# Patient Record
Sex: Female | Born: 1960 | Race: White | Hispanic: No | Marital: Single | Smoking: Never smoker
Health system: Southern US, Community
[De-identification: ages and names within clinical notes are randomized; demographics above are authoritative.]

## PROBLEM LIST (undated history)

## (undated) DIAGNOSIS — J45909 Unspecified asthma, uncomplicated: Secondary | ICD-10-CM

## (undated) DIAGNOSIS — E119 Type 2 diabetes mellitus without complications: Secondary | ICD-10-CM

## (undated) DIAGNOSIS — R011 Cardiac murmur, unspecified: Secondary | ICD-10-CM

## (undated) DIAGNOSIS — N189 Chronic kidney disease, unspecified: Secondary | ICD-10-CM

## (undated) DIAGNOSIS — M1990 Unspecified osteoarthritis, unspecified site: Secondary | ICD-10-CM

## (undated) HISTORY — DX: Cardiac murmur, unspecified: R01.1

## (undated) HISTORY — PX: BREAST BIOPSY: SHX20

## (undated) HISTORY — PX: LUMBAR SPINE SURGERY: SHX701

## (undated) HISTORY — PX: ANKLE SURGERY: SHX546

## (undated) HISTORY — DX: Unspecified asthma, uncomplicated: J45.909

## (undated) HISTORY — DX: Type 2 diabetes mellitus without complications: E11.9

## (undated) HISTORY — PX: GASTRIC BYPASS: SHX52

## (undated) HISTORY — DX: Chronic kidney disease, unspecified: N18.9

## (undated) HISTORY — DX: Unspecified osteoarthritis, unspecified site: M19.90

---
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
## 2013-12-16 HISTORY — PX: CATARACT EXTRACTION: SUR2

## 2013-12-16 HISTORY — PX: TOTAL KNEE ARTHROPLASTY: SHX125

## 2014-07-26 DIAGNOSIS — R109 Unspecified abdominal pain: Secondary | ICD-10-CM | POA: Insufficient documentation

## 2014-07-26 DIAGNOSIS — D649 Anemia, unspecified: Secondary | ICD-10-CM | POA: Insufficient documentation

## 2014-07-26 DIAGNOSIS — M2550 Pain in unspecified joint: Secondary | ICD-10-CM | POA: Insufficient documentation

## 2015-10-19 DIAGNOSIS — Z96651 Presence of right artificial knee joint: Secondary | ICD-10-CM | POA: Insufficient documentation

## 2016-05-05 DIAGNOSIS — D72829 Elevated white blood cell count, unspecified: Secondary | ICD-10-CM | POA: Insufficient documentation

## 2016-06-04 DIAGNOSIS — M4626 Osteomyelitis of vertebra, lumbar region: Secondary | ICD-10-CM | POA: Insufficient documentation

## 2016-09-11 DIAGNOSIS — Z981 Arthrodesis status: Secondary | ICD-10-CM | POA: Insufficient documentation

## 2018-09-03 DIAGNOSIS — S21209A Unspecified open wound of unspecified back wall of thorax without penetration into thoracic cavity, initial encounter: Secondary | ICD-10-CM | POA: Insufficient documentation

## 2018-09-15 ENCOUNTER — Encounter (HOSPITAL_BASED_OUTPATIENT_CLINIC_OR_DEPARTMENT_OTHER): Payer: Worker's Compensation | Attending: Internal Medicine

## 2018-09-15 DIAGNOSIS — Z981 Arthrodesis status: Secondary | ICD-10-CM | POA: Insufficient documentation

## 2018-09-15 DIAGNOSIS — Y831 Surgical operation with implant of artificial internal device as the cause of abnormal reaction of the patient, or of later complication, without mention of misadventure at the time of the procedure: Secondary | ICD-10-CM | POA: Insufficient documentation

## 2018-09-15 DIAGNOSIS — G629 Polyneuropathy, unspecified: Secondary | ICD-10-CM | POA: Diagnosis not present

## 2018-09-15 DIAGNOSIS — T8463XA Infection and inflammatory reaction due to internal fixation device of spine, initial encounter: Secondary | ICD-10-CM | POA: Diagnosis not present

## 2018-09-15 DIAGNOSIS — T8131XA Disruption of external operation (surgical) wound, not elsewhere classified, initial encounter: Secondary | ICD-10-CM | POA: Insufficient documentation

## 2018-09-15 DIAGNOSIS — N186 End stage renal disease: Secondary | ICD-10-CM | POA: Insufficient documentation

## 2018-09-16 ENCOUNTER — Other Ambulatory Visit (HOSPITAL_COMMUNITY)
Admission: RE | Admit: 2018-09-16 | Discharge: 2018-09-16 | Disposition: A | Discharge status: Home / Self Care | Payer: Medicare Other | Source: Other Acute Inpatient Hospital | Attending: Internal Medicine | Admitting: Internal Medicine

## 2018-09-16 DIAGNOSIS — L98428 Non-pressure chronic ulcer of back with other specified severity: Secondary | ICD-10-CM | POA: Insufficient documentation

## 2018-09-21 ENCOUNTER — Telehealth: Payer: Self-pay | Admitting: *Deleted

## 2018-09-21 NOTE — Telephone Encounter (Signed)
RN received message that patient had a question about her upcoming appointment. RN attempted to return call, but it was not answered and no option to leave a message. Patient has a new patient appointment 10/17 with Dr Luciana Axe, referral notes up front. Andree Coss, RN

## 2018-09-22 LAB — AEROBIC/ANAEROBIC CULTURE (SURGICAL/DEEP WOUND)

## 2018-09-22 LAB — AEROBIC/ANAEROBIC CULTURE W GRAM STAIN (SURGICAL/DEEP WOUND)

## 2018-09-23 ENCOUNTER — Ambulatory Visit: Payer: Self-pay | Admitting: Internal Medicine

## 2018-09-24 NOTE — Telephone Encounter (Signed)
RN left message asking patient to call back if she still had questions about her upcoming appointment.

## 2018-09-29 DIAGNOSIS — T8131XA Disruption of external operation (surgical) wound, not elsewhere classified, initial encounter: Secondary | ICD-10-CM | POA: Diagnosis not present

## 2018-10-01 ENCOUNTER — Encounter: Payer: Self-pay | Admitting: Internal Medicine

## 2018-10-01 ENCOUNTER — Ambulatory Visit (INDEPENDENT_AMBULATORY_CARE_PROVIDER_SITE_OTHER): Payer: Worker's Compensation | Admitting: Internal Medicine

## 2018-10-01 DIAGNOSIS — M4626 Osteomyelitis of vertebra, lumbar region: Secondary | ICD-10-CM

## 2018-10-01 DIAGNOSIS — Z981 Arthrodesis status: Secondary | ICD-10-CM | POA: Diagnosis not present

## 2018-10-01 DIAGNOSIS — M869 Osteomyelitis, unspecified: Secondary | ICD-10-CM | POA: Diagnosis not present

## 2018-10-01 NOTE — Progress Notes (Signed)
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 Leanord Hawking    Patient ID: Alison Monroe, female    DOB: 09-08-1961, 57 y.o.   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  She has a long history of spinal drainage with multiple previous surgeries and hardware placement and has been followed by several orthopedists and neurosurgeons in [place] and more recently at [facility] and has been seen by infectious diseases in [place], at the [place] clinic, and also more recently an inpatient visit at [facility].  She has moved here to [place] to be closer to a brother.  It appears part from her past notes that she has been very resistant to have definitive surgical treatment of her back by removal of the hardware.  She in the past has had MSSA and MRSA growth and has been on multiple courses of oral antibiotics as well as long-term IV antibiotics.  It was advised to her that further antibiotics for the short-term or for cure of this infection are not going to be effective in curing this infection without surgical management, in particular removal of the hardware.  She is here today at the request of her wound care physician. The nonhealing wound has been present since 2014 and she previously has undergone revision of a prior lumbar fusion extension to T9 to sacrum by a Dr. Callie Fielding in [place].  Her initial surgery was in 1997 and had been complicated by multiple infections and revisions, including plastic surgery and flap placement.  She has been told that she has fractured hardware and she has continued chronic back pain.  She has been seen at [facility] clinic as well as a new [facility] for neurosurgical recommendations and they recommended removal of the implants and to treat with IV antibiotics after removal.  She at the time did  not want to have the hardware removed and was in the process of moving to [place].  She saw a neurosurgeon, Dr. Noralee Stain, at [facility] who had recommended immediate surgery due to potential for spinal cord injury.  She though refused and wanted to get her "life affairs" in order in [place] and returned to [place] after that.  It was advised that she when she returns she presented to the emergency room at [facility] for management, which is advised to be a neurosurgeon, plastic surgeon and infectious diseases.  She is here today for my evaluation from infectious diseases and is following Dr. Leanord Hawking of wound care. Most information via Care Everywhere summarized above.  She tells me she is very hesitant to get surgery.  She has not yet moved to [facility] but is still in the process.  Her PCP in [place] suggested she see ID for another course of prolonged IV antibiotics with hope of curing the infection. She has intermittent times of drainage.    PMH: history of lumbar surgery in 1997 Chronic pain GERD  Prior to Admission medications   Not on File   Current Outpatient Medications on File Prior to Visit  Medication Sig Dispense Refill  . fentaNYL (DURAGESIC - DOSED MCG/HR) 100 MCG/HR Place 1 patch onto the skin every other day.    Marland Kitchen acetaminophen (TYLENOL) 325 MG tablet Take 2 tablets by mouth 2 (two) times daily  as needed.    . baclofen (LIORESAL) 10 MG tablet Take 1 tablet by mouth 3 (three) times daily.    . cyanocobalamin (,VITAMIN B-12,) 1000 MCG/ML injection Inject 1 mL into the skin.    Marland Kitchen HYDROmorphone (DILAUDID) 8 MG tablet Take 2 tablets by mouth every 6 (six) hours.    Marland Kitchen omeprazole (PRILOSEC) 20 MG capsule Take 1 capsule by mouth 2 (two) times daily.    . potassium chloride (KCL) 2 mEq/mL SOLN oral liquid Take 1 tablet by mouth 2 (two) times daily.    Marland Kitchen spironolactone (ALDACTONE) 25 MG tablet Take 1 tablet by mouth.     No current facility-administered medications on  file prior to visit.     Allergies: penicillins, erythromycin Vancomycin - renal failure  SH: no tobacco   Assessment: chronic lumbar infection with retained hardware.  I had a long discussion with the patient regarding her best options of cure of the infection.  I emphasized that she will need a multidisciplinary approach and absolutely will require hardware removal to attempt a cure.  I explained that continue treatment without that will not result in cure.  She is fortunate not to have significant bone involvement yet or episodes of bacteremia.   I recommended she go back to [facility] asap for further recommendations.  I am available after surgery to continue treatment locally including home health needs once the plan is in place by presumably [facility], after necessary surgical intervention.   Can use alternatives to vancomycin for treatment.   She also requests referral to pain management.   Plan: 1) go to [facility] 2) referral for pain management sent 3) remain off of antibiotics as long as possible prior to any surgical cultures  60 minutes spent with the patient including 30 minutes face to face discussion/counseling of her history and options.

## 2018-10-02 ENCOUNTER — Encounter: Payer: Self-pay | Admitting: Internal Medicine

## 2018-10-29 LAB — HEPATIC FUNCTION PANEL
ALT: 13 (ref 7–35)
AST: 17 (ref 13–35)
Alkaline Phosphatase: 130 — AB (ref 25–125)
Bilirubin, Total: 0.4

## 2018-10-29 LAB — CBC AND DIFFERENTIAL
HCT: 38 (ref 36–46)
Hemoglobin: 11.9 — AB (ref 12.0–16.0)
Neutrophils Absolute: 73
Platelets: 370 (ref 150–399)
WBC: 9.7

## 2018-10-29 LAB — BASIC METABOLIC PANEL
BUN: 23 — AB (ref 4–21)
Creatinine: 1.6 — AB (ref 0.5–1.1)
Glucose: 106
Potassium: 4.9 (ref 3.4–5.3)
Sodium: 138 (ref 137–147)

## 2018-11-06 ENCOUNTER — Encounter (HOSPITAL_BASED_OUTPATIENT_CLINIC_OR_DEPARTMENT_OTHER): Payer: Medicare Other | Attending: Internal Medicine

## 2018-11-06 DIAGNOSIS — N186 End stage renal disease: Secondary | ICD-10-CM | POA: Insufficient documentation

## 2018-11-06 DIAGNOSIS — T8142XA Infection following a procedure, deep incisional surgical site, initial encounter: Secondary | ICD-10-CM | POA: Insufficient documentation

## 2018-11-06 DIAGNOSIS — T8131XA Disruption of external operation (surgical) wound, not elsewhere classified, initial encounter: Secondary | ICD-10-CM | POA: Diagnosis not present

## 2018-11-06 DIAGNOSIS — G629 Polyneuropathy, unspecified: Secondary | ICD-10-CM | POA: Diagnosis not present

## 2018-11-06 DIAGNOSIS — Y838 Other surgical procedures as the cause of abnormal reaction of the patient, or of later complication, without mention of misadventure at the time of the procedure: Secondary | ICD-10-CM | POA: Insufficient documentation

## 2018-12-01 DIAGNOSIS — M4316 Spondylolisthesis, lumbar region: Secondary | ICD-10-CM | POA: Insufficient documentation

## 2018-12-01 DIAGNOSIS — M5416 Radiculopathy, lumbar region: Secondary | ICD-10-CM | POA: Insufficient documentation

## 2018-12-01 DIAGNOSIS — M48061 Spinal stenosis, lumbar region without neurogenic claudication: Secondary | ICD-10-CM | POA: Insufficient documentation

## 2018-12-07 ENCOUNTER — Encounter (HOSPITAL_BASED_OUTPATIENT_CLINIC_OR_DEPARTMENT_OTHER): Payer: Medicare Other | Attending: Internal Medicine

## 2018-12-07 DIAGNOSIS — Y838 Other surgical procedures as the cause of abnormal reaction of the patient, or of later complication, without mention of misadventure at the time of the procedure: Secondary | ICD-10-CM | POA: Diagnosis not present

## 2018-12-07 DIAGNOSIS — B9561 Methicillin susceptible Staphylococcus aureus infection as the cause of diseases classified elsewhere: Secondary | ICD-10-CM | POA: Insufficient documentation

## 2018-12-07 DIAGNOSIS — T8131XA Disruption of external operation (surgical) wound, not elsewhere classified, initial encounter: Secondary | ICD-10-CM | POA: Insufficient documentation

## 2018-12-07 DIAGNOSIS — N186 End stage renal disease: Secondary | ICD-10-CM | POA: Diagnosis not present

## 2018-12-17 ENCOUNTER — Ambulatory Visit (INDEPENDENT_AMBULATORY_CARE_PROVIDER_SITE_OTHER): Payer: Medicare Other | Admitting: Family Medicine

## 2018-12-17 ENCOUNTER — Other Ambulatory Visit: Payer: Self-pay

## 2018-12-17 ENCOUNTER — Encounter: Payer: Self-pay | Admitting: Family Medicine

## 2018-12-17 ENCOUNTER — Encounter: Payer: Self-pay | Admitting: *Deleted

## 2018-12-17 VITALS — BP 152/80 | HR 97 | Temp 98.8°F | Resp 16 | Ht 62.0 in | Wt 193.0 lb

## 2018-12-17 DIAGNOSIS — G894 Chronic pain syndrome: Secondary | ICD-10-CM

## 2018-12-17 DIAGNOSIS — M5441 Lumbago with sciatica, right side: Secondary | ICD-10-CM | POA: Diagnosis not present

## 2018-12-17 DIAGNOSIS — S21209A Unspecified open wound of unspecified back wall of thorax without penetration into thoracic cavity, initial encounter: Secondary | ICD-10-CM | POA: Diagnosis not present

## 2018-12-17 DIAGNOSIS — M1710 Unilateral primary osteoarthritis, unspecified knee: Secondary | ICD-10-CM | POA: Insufficient documentation

## 2018-12-17 DIAGNOSIS — R7303 Prediabetes: Secondary | ICD-10-CM | POA: Insufficient documentation

## 2018-12-17 DIAGNOSIS — N183 Chronic kidney disease, stage 3 unspecified: Secondary | ICD-10-CM | POA: Insufficient documentation

## 2018-12-17 DIAGNOSIS — M4636 Infection of intervertebral disc (pyogenic), lumbar region: Secondary | ICD-10-CM

## 2018-12-17 DIAGNOSIS — N6019 Diffuse cystic mastopathy of unspecified breast: Secondary | ICD-10-CM | POA: Insufficient documentation

## 2018-12-17 DIAGNOSIS — Z9884 Bariatric surgery status: Secondary | ICD-10-CM | POA: Insufficient documentation

## 2018-12-17 DIAGNOSIS — F1120 Opioid dependence, uncomplicated: Secondary | ICD-10-CM

## 2018-12-17 DIAGNOSIS — Z6832 Body mass index (BMI) 32.0-32.9, adult: Secondary | ICD-10-CM | POA: Insufficient documentation

## 2018-12-17 DIAGNOSIS — M5136 Other intervertebral disc degeneration, lumbar region: Secondary | ICD-10-CM | POA: Insufficient documentation

## 2018-12-17 DIAGNOSIS — G8929 Other chronic pain: Secondary | ICD-10-CM

## 2018-12-17 DIAGNOSIS — E538 Deficiency of other specified B group vitamins: Secondary | ICD-10-CM

## 2018-12-17 DIAGNOSIS — I872 Venous insufficiency (chronic) (peripheral): Secondary | ICD-10-CM | POA: Insufficient documentation

## 2018-12-17 DIAGNOSIS — Z22322 Carrier or suspected carrier of Methicillin resistant Staphylococcus aureus: Secondary | ICD-10-CM | POA: Insufficient documentation

## 2018-12-17 DIAGNOSIS — K219 Gastro-esophageal reflux disease without esophagitis: Secondary | ICD-10-CM | POA: Insufficient documentation

## 2018-12-17 DIAGNOSIS — M179 Osteoarthritis of knee, unspecified: Secondary | ICD-10-CM | POA: Insufficient documentation

## 2018-12-17 NOTE — Patient Instructions (Signed)
Please return in 1-3 months for recheck  We will call you with information regarding your referral appointment. Orthopedics at [facility], pain management and home health for wound care.  If you do not hear from [place] within the next 2 weeks, please let me know. It can take 1-2 weeks to get appointments set up with the specialists.    It was a pleasure meeting you today! Thank you for choosing [place] to meet your healthcare needs! I truly look forward to working with you. If you have any questions or concerns, please send me a message via Mychart or call the office at [phone].

## 2018-12-17 NOTE — Progress Notes (Signed)
Subjective  CC:  Chief Complaint  Patient presents with  . Establish Care    PCP in [place].. States that in 3 months or so she will be moving here.. Wants referral for home health due to wound    HPI: Alison Monroe is a 58 y.o. female who presents to [facility] at [facility] today to establish care with me as a new patient.  Reviewed chart; multiple high risk medications on chronic narcotics and benzo's.  She has the following concerns or needs:  58 year old single female disabled after work-related injury to low back in the early 40s.  Burst fractures to the lumbar spine with over 20 surgeries since.  Is managed by chronic pain in [place] where she currently lives, she is relocating here in the next 3 months.  She is on high-dose chronic narcotics and benzos, also on Topamax and baclofen for back pain and muscle cramps.  She has reported right radiculopathy.  Recent MRI was reviewed.  She is here because she needs a referral to pain management and orthopedics.  She currently is being treated by infectious disease and wound care due to an intervertebral infection.  She currently is on Bactrim twice daily.  She needs home health care for dressing changes to be ordered.  Functionally, she walks with a walker and can complete her ADLs and most instruments of daily living.  She is moving here to be with her brother.  She may need surgery again if the infection does not clear.  She is hoping to avoid this.  She has chronic peripheral venous insufficiency requiring high-dose diuretics.  This is neurologic in nature.  History of diabetes and morbid obesity status past bariatric surgery.  Most recent A1c was reportedly 6.22 weeks ago.  I will review old records.  Health maintenance: She reports the most things are up-to-date.  Will need old records to review and will update chart once done.  She denies mood problems.  No recent falls.  Assessment  1. Wound of back,  unspecified laterality, initial encounter   2. Narcotic dependence (HCC)   3. Chronic pain syndrome   4. Chronic bilateral low back pain with right-sided sciatica   5. Infection of intervertebral disc (pyogenic), lumbar region (HCC)   6. 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  Referred to chronic pain management due to chronic narcotic dependence.  Home health referral placed as well for wound care.  She will continue with infectious disease who is managing her infection.  Reviewed other medications.  No medication changes at this time.  Return in 1 month for follow-up and recheck.  Will update medical record and update health maintenance needs  Follow up:  Return in about 4 weeks (around 01/14/2019) for recheck. Orders Placed This Encounter  Procedures  . Ambulatory referral to Pain Clinic  . Ambulatory referral to Orthopedic Surgery   No orders of the defined types were placed in this encounter.    Depression screen PHQ 2/9 12/17/2018  Decreased Interest 0  Down, Depressed, Hopeless 0  PHQ - 2 Score 0    We updated and reviewed the patient's past history in detail and it is documented below.  Patient Active Problem List   Diagnosis Date Noted  . Degeneration of lumbar intervertebral disc 12/17/2018  Priority: High  . Infection of intervertebral disc (pyogenic), lumbar region [facility](HCC) 12/17/2018    Priority: High  . Narcotic dependence (HCC) 12/17/2018    Priority: High  . Peripheral venous insufficiency 12/17/2018    Priority: High  . Prediabetes 12/17/2018    Priority: High  . Chronic kidney disease, stage 3 (moderate) (HCC) 12/17/2018    Priority: High  . Chronic bilateral low back pain with right-sided sciatica 12/17/2018    Priority: High  . Chronic pain syndrome 12/17/2018     Priority: High  . Body mass index (bmi) 32.0-32.9, adult 12/17/2018    Priority: Medium  . Knee osteoarthritis 12/17/2018    Priority: Medium  . MRSA (methicillin resistant Staphylococcus aureus) colonization 12/17/2018    Priority: Medium  . S/P bariatric surgery 12/17/2018    Priority: Medium  . Fibrocystic breast changes 12/17/2018    Priority: Low  . Gastro-esophageal reflux disease without esophagitis 12/17/2018    Priority: Low  . S/P lumbar fusion 09/11/2016    Priority: Low  . Vitamin B 12 deficiency 12/17/2018  . Back wound 09/03/2018  . Leukocytosis 05/05/2016  . Status post total right knee replacement 10/19/2015   Health Maintenance  Topic Date Due  . Hepatitis C Screening  Feb 26, 1961  . HIV Screening  07/14/1976  . PAP SMEAR-Modifier  07/14/1982  . MAMMOGRAM  07/15/2011  . TETANUS/TDAP  12/17/2024  . COLONOSCOPY  12/17/2025  . INFLUENZA VACCINE  Completed    There is no immunization history on file for this patient. Current Meds  Medication Sig  . acetaminophen (TYLENOL) 325 MG tablet Take 2 tablets by mouth 2 (two) times daily as needed.  . baclofen (LIORESAL) 10 MG tablet Take 1 tablet by mouth 3 (three) times daily.  . cyanocobalamin (,VITAMIN B-12,) 1000 MCG/ML injection Inject 1 mL into the skin.  . diazepam (VALIUM) 10 MG tablet Take 1 tablet by mouth 3 (three) times daily.  . fentaNYL (DURAGESIC - DOSED MCG/HR) 100 MCG/HR Place 1 patch onto the skin every other day.  Marland Kitchen. HYDROmorphone (DILAUDID) 8 MG tablet Take 2 tablets by mouth every 6 (six) hours.  Marland Kitchen. morphine (MS CONTIN) 100 MG 12 hr tablet Take 100 mg by mouth every 12 (twelve) hours.  Marland Kitchen. omeprazole (PRILOSEC) 20 MG capsule Take 1 capsule by mouth 2 (two) times daily.  . potassium chloride (KCL) 2 mEq/mL SOLN oral liquid Take 1 tablet by mouth 2 (two) times daily.  Marland Kitchen. spironolactone (ALDACTONE) 25 MG tablet Take 1 tablet by mouth.  . temazepam (RESTORIL) 30 MG capsule Take 1 capsule by mouth at  bedtime.  Marland Kitchen. tiZANidine (ZANAFLEX) 4 MG tablet Take 1 tablet by mouth 3 (three) times daily.  Marland Kitchen. topiramate (TOPAMAX) 50 MG tablet Take 2 tablets by mouth 3 (three) times daily.  Marland Kitchen. torsemide (DEMADEX) 100 MG tablet Take 1 tablet by mouth daily.  . [DISCONTINUED] metoprolol tartrate (LOPRESSOR) 50 MG tablet Take 50 mg by mouth daily.    Allergies: Patient is allergic to erythromycin base; penicillins; vancomycin; and adhesive  [tape]. Past Medical History Patient  has a past medical history of Arthritis, Chronic kidney disease, Diabetes (HCC), and Heart murmur. Past Surgical History Patient  has a past surgical history that includes Gallbladder surgery (1981); Breast biopsy; Lumbar spine surgery; Gastric bypass; and Ankle surgery (Left). Family History: Patient family history includes Arthritis in her maternal grandmother and mother; Asthma in her maternal grandmother and mother; COPD in her father and mother; Cancer in her maternal grandfather; Depression  in her mother; Diabetes in her brother, father, maternal grandmother, and mother; Early death in her father and mother; Heart attack in her father and mother; Heart disease in her brother and mother; Hyperlipidemia in her brother and mother; Hypertension in her brother, father, and mother; Kidney disease in her father; Stroke in her father. Social History:  Patient  reports that she has never smoked. She has never used smokeless tobacco. She reports that she does not drink alcohol or use drugs.  Review of Systems: Constitutional: negative for fever or malaise Ophthalmic: negative for photophobia, double vision or loss of vision Cardiovascular: negative for chest pain, dyspnea on exertion, or new LE swelling Respiratory: negative for SOB or persistent cough Gastrointestinal: negative for abdominal pain, change in bowel habits or melena Genitourinary: negative for dysuria or gross hematuria Musculoskeletal: negative for new gait disturbance or  muscular weakness, positive for chronic pain Integumentary: negative for new or persistent rashes Neurological: negative for TIA or stroke symptoms Psychiatric: negative for SI or delusions Allergic/Immunologic: negative for hives  Patient Care Team    Relationship Specialty Notifications Start End  Willow OraAndy, Roen Macgowan L, MD PCP - General Family Medicine  12/17/18     Objective  Vitals: BP (!) 152/80   Pulse 97   Temp 98.8 F (37.1 C) (Oral)   Resp 16   Ht 5\' 2"  (1.575 m)   Wt 193 lb (87.5 kg)   SpO2 95%   BMI 35.30 kg/m  General:  Well developed, well nourished, no acute distress  Psych:  Alert and oriented,normal mood and affect HEENT:  Normocephalic, atraumatic, non-icteric sclera, PERRL, oropharynx is without mass or exudate, supple neck without adenopathy, mass or thyromegaly Cardiovascular:  RRR without gallop, rub or murmur, nondisplaced PMI, +2 pitting edema bilateral lower extremities Respiratory:  Good breath sounds bilaterally, CTAB with normal respiratory effort Gastrointestinal: normal bowel sounds, soft, non-tender, no noted masses. No HSM MSK: She has scoliosis and kyphosis, Skin:  Warm, no rashes or suspicious lesions noted, low lumbar area with open wound, iodoform gauze in place with clear dry edges. Neurologic:    Mental status is normal.  Ambulates with walker  Commons side effects, risks, benefits, and alternatives for medications and treatment plan prescribed today were discussed, and the patient expressed understanding of the given instructions. Patient is instructed to call or message via MyChart if he/she has any questions or concerns regarding our treatment plan. No barriers to understanding were identified. We discussed Red Flag symptoms and signs in detail. Patient expressed understanding regarding what to do in case of urgent or emergency type symptoms.   Medication list was reconciled, printed and provided to the patient in AVS. Patient instructions and summary  information was reviewed with the patient as documented in the AVS. This note was prepared with assistance of Dragon voice recognition software. Occasional wrong-word or sound-a-like substitutions may have occurred due to the inherent limitations of voice recognition software

## 2018-12-24 ENCOUNTER — Other Ambulatory Visit: Payer: Self-pay | Admitting: *Deleted

## 2018-12-24 ENCOUNTER — Telehealth: Payer: Self-pay

## 2018-12-24 DIAGNOSIS — S21209A Unspecified open wound of unspecified back wall of thorax without penetration into thoracic cavity, initial encounter: Secondary | ICD-10-CM

## 2018-12-24 NOTE — Telephone Encounter (Signed)
Did you get a home health referral for this patient?

## 2018-12-24 NOTE — Telephone Encounter (Signed)
There is no referral to Home Health.

## 2018-12-24 NOTE — Telephone Encounter (Signed)
Pt is aware that referral is being worked on.

## 2018-12-24 NOTE — Telephone Encounter (Signed)
Copied from CRM [id number]. Topic: Referral - Question >> Dec 24, 2018 12:58 PM Laural Benes, [place] C wrote: Reason for CRM: pt says that she has not received a call from any of her referral locations. Pt says that she is also suppose to have a home health referral. Pt says that she has a wound that has to be changed by a nurse every day 2 times daily due to drainage. Pt says at the moment she has a family member helping her but she really need to have home health services. Pt would like further assistance with this.   CB: [id number]

## 2018-12-25 NOTE — Telephone Encounter (Signed)
Made pt aware of all referrals, pt still has a question regarding a new on set with back pain. Please see note from Clydie Braun.

## 2018-12-25 NOTE — Telephone Encounter (Signed)
Have you worked on this referral yet?

## 2018-12-25 NOTE — Telephone Encounter (Signed)
Patient called back today to find out why she did not receive a call back from anyone today. She will be calling her insurance company to see if they can give her some help in finding a Home Care agency. She also requested a call from Dr Mardelle Matte or her nurse to find out if she should come in for a pain that she has been experiencing in her back. She stated that it felt as if something popped and now she is having difficulty walking and doing other things. She would like to know if Dr Mardelle Matte want her to come in for Xrays or any other testing. Please advise Ph# [phone]

## 2018-12-28 NOTE — Telephone Encounter (Signed)
Please see note from Clydie Braun and advise.

## 2018-12-28 NOTE — Telephone Encounter (Signed)
PT HAS BEEN SCHEDULED FOR 12/30/17

## 2018-12-28 NOTE — Telephone Encounter (Signed)
Pt may schedule an appointment although I am not certain I will be able to help her back pain given her history and chronic strong pain medications.

## 2018-12-29 NOTE — Telephone Encounter (Signed)
Pt called in requesting to have orders placed for a Cat Scan. Pt says that her Ortho/spinal physician Dr. Maudry Diego is requesting this for an apt on Thursday. Pt says that if she can get imaging completed then she dont feel that the ov with PCP for back pain will still be needed.   Please advise pt further on if PCP can place orders/assist with imaging order.

## 2018-12-29 NOTE — Telephone Encounter (Signed)
I am unable to order the CT scan; ;will need to see specialist and they will order needed testing after evaluation.

## 2018-12-29 NOTE — Telephone Encounter (Signed)
Please see below and advise.

## 2018-12-29 NOTE — Telephone Encounter (Signed)
Pt is aware and verbalized understanding.

## 2018-12-30 ENCOUNTER — Ambulatory Visit: Payer: Medicare Other | Admitting: Family Medicine

## 2019-01-01 ENCOUNTER — Ambulatory Visit (HOSPITAL_COMMUNITY)
Admission: RE | Admit: 2019-01-01 | Discharge: 2019-01-01 | Disposition: A | Discharge status: Home / Self Care | Payer: Medicare Other | Source: Ambulatory Visit | Attending: Family Medicine | Admitting: Family Medicine

## 2019-01-01 ENCOUNTER — Other Ambulatory Visit (HOSPITAL_COMMUNITY): Payer: Self-pay | Admitting: Family Medicine

## 2019-01-01 ENCOUNTER — Telehealth: Payer: Self-pay | Admitting: Family Medicine

## 2019-01-01 DIAGNOSIS — M2518 Fistula, other specified site: Secondary | ICD-10-CM

## 2019-01-01 DIAGNOSIS — Z981 Arthrodesis status: Secondary | ICD-10-CM

## 2019-01-01 DIAGNOSIS — T84119A Breakdown (mechanical) of internal fixation device of unspecified bone of limb, initial encounter: Secondary | ICD-10-CM | POA: Diagnosis present

## 2019-01-01 NOTE — Telephone Encounter (Signed)
Per Dr. Mardelle Matte, ok for verbal orders

## 2019-01-01 NOTE — Telephone Encounter (Signed)
Copied from CRM [id number]. Topic: Quick Communication - See Telephone Encounter >> Jan 01, 2019  1:44 PM Burchel, Abbi R wrote: CRM for notification. See Telephone encounter for: 01/01/19.  Darlene [phone] today) or Nicolette [phone] on/after Monday) requesting v/o for nursing/wound care 1x1wk, 2x1wk, 3x1wk and Home Health Aide 2x2wk for [facility]. Also, requesting v/o for- eval re: PT, OT, and MSW.

## 2019-01-01 NOTE — Telephone Encounter (Signed)
Darlene given verbal orders.

## 2019-01-05 ENCOUNTER — Telehealth: Payer: Self-pay | Admitting: Family Medicine

## 2019-01-05 ENCOUNTER — Ambulatory Visit (HOSPITAL_COMMUNITY): Payer: Medicare Other

## 2019-01-05 NOTE — Telephone Encounter (Signed)
Copied from CRM [id number]. Topic: Quick Communication - Home Health Verbal Orders >> Jan 05, 2019  9:21 AM Percival Spanish wrote: Caller/Agency   Tatiana with [facility]   Callback Number  [phone]  Requesting verbal orders PT   Frequency   2 x 2    1 x 1 effective 01/03/2019

## 2019-01-05 NOTE — Telephone Encounter (Signed)
Verbal order given to Tatiana. 

## 2019-01-11 ENCOUNTER — Telehealth: Payer: Self-pay | Admitting: Family Medicine

## 2019-01-11 DIAGNOSIS — G894 Chronic pain syndrome: Secondary | ICD-10-CM

## 2019-01-11 DIAGNOSIS — T8131XA Disruption of external operation (surgical) wound, not elsewhere classified, initial encounter: Secondary | ICD-10-CM | POA: Diagnosis not present

## 2019-01-11 DIAGNOSIS — M4636 Infection of intervertebral disc (pyogenic), lumbar region: Secondary | ICD-10-CM | POA: Diagnosis not present

## 2019-01-11 DIAGNOSIS — M5441 Lumbago with sciatica, right side: Secondary | ICD-10-CM

## 2019-01-11 DIAGNOSIS — T8142XA Infection following a procedure, deep incisional surgical site, initial encounter: Secondary | ICD-10-CM | POA: Diagnosis not present

## 2019-01-11 DIAGNOSIS — Z9181 History of falling: Secondary | ICD-10-CM

## 2019-01-11 DIAGNOSIS — E538 Deficiency of other specified B group vitamins: Secondary | ICD-10-CM

## 2019-01-11 DIAGNOSIS — E119 Type 2 diabetes mellitus without complications: Secondary | ICD-10-CM

## 2019-01-11 DIAGNOSIS — Z792 Long term (current) use of antibiotics: Secondary | ICD-10-CM

## 2019-01-11 DIAGNOSIS — M47816 Spondylosis without myelopathy or radiculopathy, lumbar region: Secondary | ICD-10-CM | POA: Diagnosis not present

## 2019-01-11 NOTE — Telephone Encounter (Signed)
Awaiting Dr. Modesta MessingAndy's signature

## 2019-01-11 NOTE — Telephone Encounter (Signed)
Received home health certification forms by fax. Placed in bin upfront w/charge sheet.

## 2019-01-11 NOTE — Telephone Encounter (Signed)
Completed and faxed to [facility]

## 2019-01-12 ENCOUNTER — Ambulatory Visit (INDEPENDENT_AMBULATORY_CARE_PROVIDER_SITE_OTHER): Payer: Medicare Other | Admitting: Family Medicine

## 2019-01-12 ENCOUNTER — Other Ambulatory Visit: Payer: Self-pay

## 2019-01-12 ENCOUNTER — Encounter: Payer: Self-pay | Admitting: Family Medicine

## 2019-01-12 VITALS — BP 114/66 | HR 70 | Temp 98.2°F | Resp 16 | Ht 62.0 in | Wt 194.6 lb

## 2019-01-12 DIAGNOSIS — N183 Chronic kidney disease, stage 3 unspecified: Secondary | ICD-10-CM

## 2019-01-12 DIAGNOSIS — M4636 Infection of intervertebral disc (pyogenic), lumbar region: Secondary | ICD-10-CM | POA: Diagnosis not present

## 2019-01-12 DIAGNOSIS — G894 Chronic pain syndrome: Secondary | ICD-10-CM

## 2019-01-12 DIAGNOSIS — R7303 Prediabetes: Secondary | ICD-10-CM

## 2019-01-12 DIAGNOSIS — E538 Deficiency of other specified B group vitamins: Secondary | ICD-10-CM

## 2019-01-12 DIAGNOSIS — F1120 Opioid dependence, uncomplicated: Secondary | ICD-10-CM

## 2019-01-12 LAB — COMPREHENSIVE METABOLIC PANEL
ALT: 14 U/L (ref 0–35)
AST: 21 U/L (ref 0–37)
Albumin: 3.7 g/dL (ref 3.5–5.2)
Alkaline Phosphatase: 106 U/L (ref 39–117)
BUN: 32 mg/dL — ABNORMAL HIGH (ref 6–23)
CO2: 22 mEq/L (ref 19–32)
Calcium: 8.8 mg/dL (ref 8.4–10.5)
Chloride: 110 mEq/L (ref 96–112)
Creatinine, Ser: 1.68 mg/dL — ABNORMAL HIGH (ref 0.40–1.20)
GFR: 31.35 mL/min — ABNORMAL LOW (ref >60.00)
Glucose, Bld: 98 mg/dL (ref 70–99)
Potassium: 5 mEq/L (ref 3.5–5.1)
Sodium: 142 mEq/L (ref 135–145)
Total Bilirubin: 0.2 mg/dL (ref 0.2–1.2)
Total Protein: 7.1 g/dL (ref 6.0–8.3)

## 2019-01-12 LAB — MICROALBUMIN / CREATININE URINE RATIO
Creatinine,U: 110.9 mg/dL
Microalb Creat Ratio: 0.6 mg/g (ref 0.0–30.0)
Microalb, Ur: <0.7 mg/dL (ref 0.0–1.9)

## 2019-01-12 LAB — HEMOGLOBIN A1C: Hgb A1c MFr Bld: 6 % (ref 4.6–6.5)

## 2019-01-12 LAB — LIPID PANEL
Cholesterol: 182 mg/dL (ref 0–200)
HDL: 56.1 mg/dL (ref >39.00)
LDL Cholesterol: 113 mg/dL — ABNORMAL HIGH (ref 0–99)
NonHDL: 125.75
Total CHOL/HDL Ratio: 3
Triglycerides: 64 mg/dL (ref 0.0–149.0)
VLDL: 12.8 mg/dL (ref 0.0–40.0)

## 2019-01-12 LAB — CBC WITH DIFFERENTIAL/PLATELET
Basophils Absolute: 0 10*3/uL (ref 0.0–0.1)
Basophils Relative: 0.3 % (ref 0.0–3.0)
Eosinophils Absolute: 0.2 10*3/uL (ref 0.0–0.7)
Eosinophils Relative: 2.2 % (ref 0.0–5.0)
HCT: 38.5 % (ref 36.0–46.0)
Hemoglobin: 12 g/dL (ref 12.0–15.0)
Lymphocytes Relative: 17.3 % (ref 12.0–46.0)
Lymphs Abs: 1.3 10*3/uL (ref 0.7–4.0)
MCHC: 31.1 g/dL (ref 30.0–36.0)
MCV: 80.5 fl (ref 78.0–100.0)
Monocytes Absolute: 0.6 10*3/uL (ref 0.1–1.0)
Monocytes Relative: 7.2 % (ref 3.0–12.0)
Neutro Abs: 5.6 10*3/uL (ref 1.4–7.7)
Neutrophils Relative %: 73 % (ref 43.0–77.0)
Platelets: 304 10*3/uL (ref 150.0–400.0)
RBC: 4.78 Mil/uL (ref 3.87–5.11)
RDW: 19 % — ABNORMAL HIGH (ref 11.5–15.5)
WBC: 7.7 10*3/uL (ref 4.0–10.5)

## 2019-01-12 LAB — TSH: TSH: 5.26 u[IU]/mL — ABNORMAL HIGH (ref 0.35–4.50)

## 2019-01-12 LAB — VITAMIN B12: Vitamin B-12: 361 pg/mL (ref 211–911)

## 2019-01-12 NOTE — Patient Instructions (Signed)
Please return in 3-4 months for recheck once you return from [place].   Please discuss with your neurosurgeon there the plan for the future surgical and pain management care. Please bring copies of records from these visit when you return.   Please go get your eyes examined.   If you have any questions or concerns, please don't hesitate to send me a message via MyChart or call the office at [phone]. Thank you for visiting with Alison Monroe today! It's our pleasure caring for you.

## 2019-01-12 NOTE — Progress Notes (Signed)
Subjective  CC:  Chief Complaint  Patient presents with  . Follow-up  . Pain    Had a CT scan 01/01/19  . Back wound    Taking Bactrium    HPI: Alison Monroe is a 58 y.o. female who presents to the office today to address the problems listed above in the chief complaint.  Returns for recheck and f/u: complicated history with back pain/multiple surgeries and chronic infection on chronic opioids and abx. I have reviewed most recent records from ID and Ortho from [place].   Pt is returning to [place] this week for the next several months. She will f/u with [facility] who manages her chronic pain there. She has been declined by a pain management center here.   Surgery has been recommended for hardware removal; pt has refused.   Here today to f/u on other medical problems listed below. She feels her sugars are doing well. Needs labs. She is not on diabetic medications, ace or statin. She has chronic renal disease.   Assessment  1. Chronic pain syndrome   2. Chronic kidney disease, stage 3 (moderate) (HCC)   3. Prediabetes   4. Infection of intervertebral disc (pyogenic), lumbar region (HCC)   5. Narcotic dependence (HCC)   6. Vitamin B 12 deficiency      Plan   Chronic pain:  Will see of other center will take her. Emphasized need to have pain mgt in place prior to moving here or to work with specialist in [place] to wean meds.   Back: per ns and ortho and ID  Check diabetes and lipids and renal function. Will make recs on meds after results reviewed.   Check vit B12. Pt wanted injection today but would like level first.   Narcotic dependence.   Follow up: Return in about 3 months (around 04/13/2019) for recheck.  Visit date not found  Orders Placed This Encounter  Procedures  . CBC with Differential/Platelet  . Comprehensive metabolic panel  . Lipid panel  . TSH  . Hemoglobin A1c  . Vitamin B12  . Microalbumin / creatinine urine ratio   No orders of the defined types were  placed in this encounter.     I reviewed the patients updated PMH, FH, and SocHx.    Patient Active Problem List   Diagnosis Date Noted  . Degeneration of lumbar intervertebral disc 12/17/2018    Priority: High  . Infection of intervertebral disc (pyogenic), lumbar region [facility]) 12/17/2018    Priority: High  . Narcotic dependence (HCC) 12/17/2018    Priority: High  . Peripheral venous insufficiency 12/17/2018    Priority: High  . Prediabetes 12/17/2018    Priority: High  . Chronic kidney disease, stage 3 (moderate) (HCC) 12/17/2018    Priority: High  . Chronic bilateral low back pain with right-sided sciatica 12/17/2018    Priority: High  . Chronic pain syndrome 12/17/2018    Priority: High  . Body mass index (bmi) 32.0-32.9, adult 12/17/2018    Priority: Medium  . Knee osteoarthritis 12/17/2018    Priority: Medium  . MRSA (methicillin resistant Staphylococcus aureus) colonization 12/17/2018    Priority: Medium  . S/P bariatric surgery 12/17/2018    Priority: Medium  . Fibrocystic breast changes 12/17/2018    Priority: Low  . Gastro-esophageal reflux disease without esophagitis 12/17/2018    Priority: Low  . S/P lumbar fusion 09/11/2016    Priority: Low  . Vitamin B 12 deficiency 12/17/2018  . Back wound 09/03/2018  .  Leukocytosis 05/05/2016  . Status post total right knee replacement 10/19/2015   Current Meds  Medication Sig  . acetaminophen (TYLENOL) 325 MG tablet Take 2 tablets by mouth 2 (two) times daily as needed.  . baclofen (LIORESAL) 10 MG tablet Take 1 tablet by mouth 3 (three) times daily.  . cyanocobalamin (,VITAMIN B-12,) 1000 MCG/ML injection Inject 1 mL into the skin.  . diazepam (VALIUM) 10 MG tablet Take 1 tablet by mouth 3 (three) times daily.  . diclofenac (VOLTAREN) 50 MG EC tablet Take 50 mg by mouth 2 (two) times daily.  . fentaNYL (DURAGESIC - DOSED MCG/HR) 100 MCG/HR Place 1 patch onto the skin every other day.  Marland Kitchen. HYDROmorphone (DILAUDID) 8 MG  tablet Take 2 tablets by mouth every 6 (six) hours.  . metoprolol tartrate (LOPRESSOR) 25 MG tablet Take 1 tablet by mouth daily.  Marland Kitchen. morphine (MS CONTIN) 100 MG 12 hr tablet Take 100 mg by mouth every 12 (twelve) hours.  Marland Kitchen. omeprazole (PRILOSEC) 20 MG capsule Take 1 capsule by mouth 2 (two) times daily.  . potassium chloride (KCL) 2 mEq/mL SOLN oral liquid Take 1 tablet by mouth 2 (two) times daily.  Marland Kitchen. spironolactone (ALDACTONE) 25 MG tablet Take 1 tablet by mouth.  . sulfamethoxazole-trimethoprim (BACTRIM DS,SEPTRA DS) 800-160 MG tablet   . temazepam (RESTORIL) 30 MG capsule Take 1 capsule by mouth at bedtime.  Marland Kitchen. tiZANidine (ZANAFLEX) 4 MG tablet Take 1 tablet by mouth 3 (three) times daily.  Marland Kitchen. topiramate (TOPAMAX) 50 MG tablet Take 2 tablets by mouth 3 (three) times daily.  Marland Kitchen. torsemide (DEMADEX) 100 MG tablet Take 1 tablet by mouth daily.    Allergies: Patient is allergic to erythromycin base; penicillins; vancomycin; and adhesive  [tape]. Family History: Patient family history includes Arthritis in her maternal grandmother and mother; Asthma in her maternal grandmother and mother; COPD in her father and mother; Cancer in her maternal grandfather; Depression in her mother; Diabetes in her brother, father, maternal grandmother, and mother; Early death in her father and mother; Heart attack in her father and mother; Heart disease in her brother and mother; Hyperlipidemia in her brother and mother; Hypertension in her brother, father, and mother; Kidney disease in her father; Stroke in her father. Social History:  Patient  reports that she has never smoked. She has never used smokeless tobacco. She reports that she does not drink alcohol or use drugs.  Review of Systems: Constitutional: Negative for fever malaise or anorexia Cardiovascular: negative for chest pain Respiratory: negative for SOB or persistent cough Gastrointestinal: negative for abdominal pain  Objective  Vitals: BP 114/66    Pulse 70   Temp 98.2 F (36.8 C) (Oral)   Resp 16   Ht 5\' 2"  (1.575 m)   Wt 194 lb 9.6 oz (88.3 kg)   SpO2 98%   BMI 35.59 kg/m  General: no acute distress , A&Ox3 HEENT: PEERL, conjunctiva normal, Oropharynx moist,neck is supple Cardiovascular:  RRR without murmur or gallop.  Respiratory:  Good breath sounds bilaterally, CTAB with normal respiratory effort Skin:  Warm, no rashes     Commons side effects, risks, benefits, and alternatives for medications and treatment plan prescribed today were discussed, and the patient expressed understanding of the given instructions. Patient is instructed to call or message via MyChart if he/she has any questions or concerns regarding our treatment plan. No barriers to understanding were identified. We discussed Red Flag symptoms and signs in detail. Patient expressed understanding regarding what to do  in case of urgent or emergency type symptoms.   Medication list was reconciled, printed and provided to the patient in AVS. Patient instructions and summary information was reviewed with the patient as documented in the AVS. This note was prepared with assistance of Dragon voice recognition software. Occasional wrong-word or sound-a-like substitutions may have occurred due to the inherent limitations of voice recognition software

## 2019-01-13 ENCOUNTER — Encounter: Payer: Self-pay | Admitting: *Deleted

## 2019-01-13 ENCOUNTER — Other Ambulatory Visit (INDEPENDENT_AMBULATORY_CARE_PROVIDER_SITE_OTHER): Payer: Medicare Other

## 2019-01-13 DIAGNOSIS — R7989 Other specified abnormal findings of blood chemistry: Secondary | ICD-10-CM

## 2019-01-13 LAB — T4, FREE: Free T4: 0.93 ng/dL (ref 0.60–1.60)

## 2019-01-13 LAB — T3, FREE: T3, Free: 2.9 pg/mL (ref 2.3–4.2)

## 2019-01-13 NOTE — Progress Notes (Signed)
Please call patient: I have reviewed his/her lab results. Her sugar test remains in the prediabetic range. Cholesterol levels are ok and renal function is stable. I do not recommend any other medications for prediabetes or cholesterol at this time. Vitamin B12 is low normal so she may continue her Vitamin B12 injections, here or in [place] is fine.   Katie, Please add on a Free T4 and total T3 due to abnormal TSH. Thanks.   The 10-year ASCVD risk score Denman George DC Montez Hageman., et al., 2013) is: 1.7%   Values used to calculate the score:     Age: 58 years     Sex: Female     Is Non-Hispanic African American: No     Diabetic: No     Tobacco smoker: No     Systolic Blood Pressure: 114 mmHg     Is BP treated: No     HDL Cholesterol: 56.1 mg/dL     Total Cholesterol: 182 mg/dL

## 2019-01-13 NOTE — Telephone Encounter (Signed)
-----   Message from Willow Ora, MD sent at 01/13/2019  7:59 AM EST ----- Please call patient: I have reviewed his/her lab results. Her sugar test remains in the prediabetic range. Cholesterol levels are ok and renal function is stable. I do not recommend any other medications for prediabetes or cholesterol at this time. Vitamin B12 is low normal so she may continue her Vitamin B12 injections, here or in [place] is fine.   Katie, Please add on a Free T4 and total T3 due to abnormal TSH. Thanks.   The 10-year ASCVD risk score Denman George DC Montez Hageman., et al., 2013) is: 1.7%   Values used to calculate the score:     Age: 58 years     Sex: Female     Is Non-Hispanic African American: No     Diabetic: No     Tobacco smoker: No     Systolic Blood Pressure: 114 mmHg     Is BP treated: No     HDL Cholesterol: 56.1 mg/dL     Total Cholesterol: 182 mg/dL

## 2019-01-13 NOTE — Telephone Encounter (Signed)
Add on for free t4 and total t3 have been sent to [facility].

## 2019-01-26 DIAGNOSIS — T84498A Other mechanical complication of other internal orthopedic devices, implants and grafts, initial encounter: Secondary | ICD-10-CM | POA: Insufficient documentation

## 2019-02-15 ENCOUNTER — Telehealth: Payer: Self-pay

## 2019-02-15 DIAGNOSIS — S21209A Unspecified open wound of unspecified back wall of thorax without penetration into thoracic cavity, initial encounter: Secondary | ICD-10-CM

## 2019-02-15 DIAGNOSIS — M4636 Infection of intervertebral disc (pyogenic), lumbar region: Secondary | ICD-10-CM

## 2019-02-15 NOTE — Telephone Encounter (Signed)
Please advise 

## 2019-02-15 NOTE — Telephone Encounter (Signed)
Ok to set up referrals as requested

## 2019-02-15 NOTE — Telephone Encounter (Signed)
Copied from CRM [id number]. Topic: Referral - Request for Referral >> Feb 15, 2019 10:41 AM Jay Schlichter wrote: Has patient seen PCP for this complaint? Yes.   *If NO, is insurance requiring patient see PCP for this issue before PCP can refer them? Referral for which specialty: home health  Preferred provider/office: same agency as before - [facility]  Reason for referral: wound care and physical therapy, and occupational therapy for 2 trigger fingers that have been locking.  Pt will be in [place] 02/20/19 to 03/06/19 Cb is [phone]

## 2019-02-15 NOTE — Addendum Note (Signed)
Addended by: Erenest Blank on: 02/15/2019 01:02 PM   Modules accepted: Orders

## 2019-02-24 ENCOUNTER — Telehealth: Payer: Self-pay | Admitting: Family Medicine

## 2019-02-24 DIAGNOSIS — M5441 Lumbago with sciatica, right side: Secondary | ICD-10-CM

## 2019-02-24 DIAGNOSIS — G8929 Other chronic pain: Secondary | ICD-10-CM

## 2019-02-24 DIAGNOSIS — M4636 Infection of intervertebral disc (pyogenic), lumbar region: Secondary | ICD-10-CM

## 2019-02-24 DIAGNOSIS — S21209A Unspecified open wound of unspecified back wall of thorax without penetration into thoracic cavity, initial encounter: Secondary | ICD-10-CM

## 2019-02-24 DIAGNOSIS — M5136 Other intervertebral disc degeneration, lumbar region: Secondary | ICD-10-CM

## 2019-02-24 NOTE — Telephone Encounter (Signed)
Copied from CRM [id number]. Topic: Quick Communication - Home Health Verbal Orders >> Feb 24, 2019  2:20 PM Darron Doom wrote: Caller/Agency: Kyla Balzarine / [facility] Callback Number: [phone] Requesting OT/PT/Skilled Nursing/Social Work/Speech Therapy: PT Frequency: twice a week for 2 wks, affective 02/23/2019

## 2019-02-25 NOTE — Telephone Encounter (Signed)
Yes please. But please check chart because I think Tiara just did this a few weeks ago? Is she switching home health agency?

## 2019-02-26 NOTE — Telephone Encounter (Signed)
Called both pt and home health. Pt never picked up and could not leave a voicemail. Home health VM was full and could not leave the verbal ok.   Ok for [facility] to Discuss results / PCP recommendations / Schedule patient.

## 2019-03-02 NOTE — Telephone Encounter (Signed)
Call returned to [facility] for verbal order.  Copied from CRM [id number]. Topic: Quick Communication - Home Health Verbal Orders >> Mar 02, 2019 11:00 AM Richarda Blade wrote: Caller/Agency: [facility] Callback Number: [phone] Annabelle Harman Requesting OT/PT/Skilled Nursing/Social Work/Speech Therapy:  Frequency: Put home health orders on hold because the patient flew to [place] but is hoping to return

## 2019-04-13 ENCOUNTER — Ambulatory Visit: Payer: Medicare Other | Admitting: Family Medicine

## 2019-05-26 NOTE — Telephone Encounter (Signed)
Needs ov

## 2019-05-26 NOTE — Telephone Encounter (Signed)
Pt calling back states that she is back in [place] for right now. Pt does not know how long she will be home. Pt states that she wound care and home health. Pt would like a call back from the nurse regarding. Please advise

## 2019-05-26 NOTE — Addendum Note (Signed)
Addended by: Layla Barter on: 05/26/2019 02:28 PM   Modules accepted: Orders

## 2019-05-26 NOTE — Telephone Encounter (Signed)
Pt aware [facility] referral sent and to keep appointment as virtual

## 2019-05-26 NOTE — Telephone Encounter (Signed)
Can this referral be made? 

## 2019-05-26 NOTE — Telephone Encounter (Signed)
See note

## 2019-05-28 ENCOUNTER — Encounter: Payer: Self-pay | Admitting: Family Medicine

## 2019-05-28 ENCOUNTER — Ambulatory Visit (INDEPENDENT_AMBULATORY_CARE_PROVIDER_SITE_OTHER): Payer: Medicare Other | Admitting: Family Medicine

## 2019-05-28 ENCOUNTER — Other Ambulatory Visit: Payer: Self-pay

## 2019-05-28 VITALS — BP 126/62

## 2019-05-28 DIAGNOSIS — S21209D Unspecified open wound of unspecified back wall of thorax without penetration into thoracic cavity, subsequent encounter: Secondary | ICD-10-CM | POA: Diagnosis not present

## 2019-05-28 DIAGNOSIS — G894 Chronic pain syndrome: Secondary | ICD-10-CM

## 2019-05-28 DIAGNOSIS — M6530 Trigger finger, unspecified finger: Secondary | ICD-10-CM

## 2019-05-28 DIAGNOSIS — J45909 Unspecified asthma, uncomplicated: Secondary | ICD-10-CM | POA: Insufficient documentation

## 2019-05-28 DIAGNOSIS — M4636 Infection of intervertebral disc (pyogenic), lumbar region: Secondary | ICD-10-CM

## 2019-05-28 DIAGNOSIS — M19049 Primary osteoarthritis, unspecified hand: Secondary | ICD-10-CM | POA: Insufficient documentation

## 2019-05-28 DIAGNOSIS — N183 Chronic kidney disease, stage 3 unspecified: Secondary | ICD-10-CM

## 2019-05-28 DIAGNOSIS — I059 Rheumatic mitral valve disease, unspecified: Secondary | ICD-10-CM | POA: Insufficient documentation

## 2019-05-28 DIAGNOSIS — R7303 Prediabetes: Secondary | ICD-10-CM

## 2019-05-28 DIAGNOSIS — E538 Deficiency of other specified B group vitamins: Secondary | ICD-10-CM

## 2019-05-28 DIAGNOSIS — F1120 Opioid dependence, uncomplicated: Secondary | ICD-10-CM

## 2019-05-28 NOTE — Progress Notes (Signed)
Subjective  CC:  Chief Complaint  Patient presents with  . Pain  . Wound care    Still has back wound, using Zinc cream or Septra as well as HH care services    I connected with Alison Monroe on 05/28/19 at 10:00 AM EDT by a video enabled telemedicine application and verified that I am speaking with the correct person using two identifiers. Location patient: Home Location provider: [facility] [address] Persons participating in the virtual visit: Alison Monroe, Alison Oraamille L Mariajose Mow, MD Alison Monroe, CMA  I discussed the limitations of evaluation and management by telemedicine and the availability of in person appointments. The patient expressed understanding and agreed to proceed. HPI: Alison Monroe is a 58 y.o. female who presents to the office today to address the problems listed above in the chief complaint.  58 yo with comlicated medical problems as listed in PL [place]. I reviewed notes from care everywhere.   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 Is trying to get it set up here. Has multiple specialist involved in care in two different states.   Prediabetes: improved by report. a1c 5.8 when last checked.   CDK stage 3, stable. No edema.   Vit b12 on monthly injections. Reports normal levels 2 months ago.   C/o 2 locking trigger fingers: wants to see hand ortho.   Assessment  1. Chronic pain syndrome   2. Infection of intervertebral disc (pyogenic), lumbar region (HCC)   3. Wound of back, unspecified laterality, subsequent encounter   4. Chronic kidney disease, stage 3 (moderate) (HCC)   5. Prediabetes   6. Narcotic dependence (HCC)   7. Vitamin B 12 deficiency   8. Trigger finger, unspecified finger, unspecified laterality      Plan   Wound and lumbar infection:  HH nursing referral placed to help with wound care.   Pain managed by pain clinic. Chronic narcotics. I declined PT and OT referrals. No chronic need. rec doing home exercises.   CKD and Prediabetes and vit b12 are stable.   Refer to ortho.   F[place] after [place] from [place]: [place] will be back in [place] in 1-2 months.   Follow up: after [place] from [place].  Visit date not found  Orders Placed This Encounter  Procedures  . Ambulatory referral to Orthopedic Surgery   No orders of the defined types were placed in this encounter.     I reviewed the patients updated PMH, FH, and SocHx.    Patient Active Problem List   Diagnosis Date Noted  . Degeneration of lumbar intervertebral disc 12/17/2018    Priority: High  . Infection of intervertebral disc (pyogenic), lumbar region [facility](HCC) 12/17/2018    Priority: High  . Narcotic dependence (HCC) 12/17/2018    Priority: High  . Peripheral venous insufficiency 12/17/2018    Priority: High  . Prediabetes 12/17/2018    Priority: High  . Chronic kidney disease, stage 3 (moderate) (HCC) 12/17/2018    Priority: High  . Chronic bilateral low back pain with right-sided sciatica 12/17/2018    Priority: High  . Chronic pain syndrome 12/17/2018    Priority: High  . Body mass index (bmi) 32.0-32.9, adult 12/17/2018    Priority: Medium  . Knee osteoarthritis 12/17/2018    Priority: Medium  . MRSA (methicillin resistant Staphylococcus aureus) colonization 12/17/2018  Priority: Medium  . S/P bariatric surgery 12/17/2018    Priority: Medium  . Fibrocystic breast changes 12/17/2018    Priority: Low  . Gastro-esophageal reflux disease without esophagitis 12/17/2018    Priority: Low  . S/P lumbar fusion 09/11/2016    Priority: Low  . Asthma without status asthmaticus 05/28/2019  . Localized, primary osteoarthritis of hand 05/28/2019  . Mitral valve disorder 05/28/2019  . Loosening of hardware in spine (HCC) 01/26/2019  . Vitamin B 12 deficiency 12/17/2018  . Lumbar nerve root impingement  12/01/2018  . Lumbar foraminal stenosis 12/01/2018  . Spondylolisthesis of lumbar region 12/01/2018  . Back wound 09/03/2018  . Infection of lumbar spine (HCC) 06/04/2016  . Leukocytosis 05/05/2016  . Status post total right knee replacement 10/19/2015   Current Meds  Medication Sig  . acetaminophen (TYLENOL) 325 MG tablet Take 2 tablets by mouth 2 (two) times daily as needed.  . cyanocobalamin (,VITAMIN B-12,) 1000 MCG/ML injection Inject 1 mL into the skin.  . diazepam (VALIUM) 10 MG tablet Take 1 tablet by mouth 3 (three) times daily.  . diclofenac sodium (VOLTAREN) 1 % GEL Apply topically.  . diphenhydrAMINE (BENADRYL ALLERGY) 25 mg capsule   . fentaNYL (DURAGESIC - DOSED MCG/HR) 100 MCG/HR Place 1 patch onto the skin every other day.  Marland Kitchen. HYDROmorphone (DILAUDID) 8 MG tablet Take 2 tablets by mouth every 6 (six) hours.  Marland Kitchen. omeprazole (PRILOSEC) 20 MG capsule Take 1 capsule by mouth 2 (two) times daily.  . temazepam (RESTORIL) 30 MG capsule Take 1 capsule by mouth at bedtime.  Marland Kitchen. tiZANidine (ZANAFLEX) 4 MG tablet Take 1 tablet by mouth 3 (three) times daily.  Marland Kitchen. torsemide (DEMADEX) 100 MG tablet Take 1 tablet by mouth daily.  . [DISCONTINUED] baclofen (LIORESAL) 10 MG tablet Take 1 tablet by mouth 3 (three) times daily.  . [DISCONTINUED] potassium chloride (KCL) 2 mEq/mL SOLN oral liquid Take 1 tablet by mouth 2 (two) times daily.  . [DISCONTINUED] topiramate (TOPAMAX) 50 MG tablet Take 2 tablets by mouth 3 (three) times daily.    Allergies: Patient is allergic to dextran; erythromycin base; other; penicillins; vancomycin; and adhesive  [tape]. Family History: Patient family history includes Arthritis in her maternal grandmother and mother; Asthma in her maternal grandmother and mother; COPD in her father and mother; Cancer in her maternal grandfather; Depression in her mother; Diabetes in her brother, father, maternal grandmother, and mother; Early death in her father and mother; Heart  attack in her father and mother; Heart disease in her brother and mother; Hyperlipidemia in her brother and mother; Hypertension in her brother, father, and mother; Kidney disease in her father; Stroke in her father. Social History:  Patient  reports that she has never smoked. She has never used smokeless tobacco. She reports that she does not drink alcohol or use drugs.  Review of Systems: Constitutional: Negative for fever malaise or anorexia Cardiovascular: negative for chest pain Respiratory: negative for SOB or persistent cough Gastrointestinal: negative for abdominal pain  Objective  Vitals: BP 126/62  General: no acute distress , A&Ox3   Commons side effects, risks, benefits, and alternatives for medications and treatment plan prescribed today were discussed, and the patient expressed understanding of the given instructions. Patient is instructed to call or message via MyChart if he/she has any questions or concerns regarding our treatment plan. No barriers to understanding were identified. We discussed Red Flag symptoms and signs in detail. Patient expressed understanding regarding what to do in case of  urgent or emergency type symptoms.   Medication list was reconciled, printed and provided to the patient in AVS. Patient instructions and summary information was reviewed with the patient as documented in the AVS. This note was prepared with assistance of Dragon voice recognition software. Occasional wrong-word or sound-a-like substitutions may have occurred due to the inherent limitations of voice recognition software

## 2019-06-08 ENCOUNTER — Ambulatory Visit: Payer: Medicare Other | Admitting: Family Medicine

## 2019-06-21 DIAGNOSIS — M6530 Trigger finger, unspecified finger: Secondary | ICD-10-CM | POA: Insufficient documentation

## 2019-06-28 DIAGNOSIS — K219 Gastro-esophageal reflux disease without esophagitis: Secondary | ICD-10-CM

## 2019-06-28 DIAGNOSIS — Z7982 Long term (current) use of aspirin: Secondary | ICD-10-CM

## 2019-06-28 DIAGNOSIS — T8131XA Disruption of external operation (surgical) wound, not elsewhere classified, initial encounter: Secondary | ICD-10-CM | POA: Diagnosis not present

## 2019-06-28 DIAGNOSIS — E1151 Type 2 diabetes mellitus with diabetic peripheral angiopathy without gangrene: Secondary | ICD-10-CM

## 2019-06-28 DIAGNOSIS — G894 Chronic pain syndrome: Secondary | ICD-10-CM

## 2019-06-28 DIAGNOSIS — T8142XA Infection following a procedure, deep incisional surgical site, initial encounter: Secondary | ICD-10-CM | POA: Diagnosis not present

## 2019-06-28 DIAGNOSIS — Z79891 Long term (current) use of opiate analgesic: Secondary | ICD-10-CM

## 2019-06-28 DIAGNOSIS — M5441 Lumbago with sciatica, right side: Secondary | ICD-10-CM

## 2019-06-28 DIAGNOSIS — M4316 Spondylolisthesis, lumbar region: Secondary | ICD-10-CM

## 2019-06-28 DIAGNOSIS — M5136 Other intervertebral disc degeneration, lumbar region: Secondary | ICD-10-CM

## 2019-06-28 DIAGNOSIS — M5442 Lumbago with sciatica, left side: Secondary | ICD-10-CM

## 2019-06-28 DIAGNOSIS — M47816 Spondylosis without myelopathy or radiculopathy, lumbar region: Secondary | ICD-10-CM

## 2019-06-28 DIAGNOSIS — M19041 Primary osteoarthritis, right hand: Secondary | ICD-10-CM

## 2019-06-28 DIAGNOSIS — E1122 Type 2 diabetes mellitus with diabetic chronic kidney disease: Secondary | ICD-10-CM

## 2019-06-28 DIAGNOSIS — M4636 Infection of intervertebral disc (pyogenic), lumbar region: Secondary | ICD-10-CM

## 2019-06-28 DIAGNOSIS — F1120 Opioid dependence, uncomplicated: Secondary | ICD-10-CM

## 2019-06-28 DIAGNOSIS — I340 Nonrheumatic mitral (valve) insufficiency: Secondary | ICD-10-CM

## 2019-06-28 DIAGNOSIS — B9562 Methicillin resistant Staphylococcus aureus infection as the cause of diseases classified elsewhere: Secondary | ICD-10-CM | POA: Diagnosis not present

## 2019-06-28 DIAGNOSIS — M179 Osteoarthritis of knee, unspecified: Secondary | ICD-10-CM

## 2019-06-28 DIAGNOSIS — Z9181 History of falling: Secondary | ICD-10-CM

## 2019-06-28 DIAGNOSIS — I872 Venous insufficiency (chronic) (peripheral): Secondary | ICD-10-CM

## 2019-06-28 DIAGNOSIS — R32 Unspecified urinary incontinence: Secondary | ICD-10-CM

## 2019-06-28 DIAGNOSIS — Z96651 Presence of right artificial knee joint: Secondary | ICD-10-CM

## 2019-06-28 DIAGNOSIS — Z792 Long term (current) use of antibiotics: Secondary | ICD-10-CM

## 2019-06-28 DIAGNOSIS — N183 Chronic kidney disease, stage 3 (moderate): Secondary | ICD-10-CM

## 2019-06-28 DIAGNOSIS — J45909 Unspecified asthma, uncomplicated: Secondary | ICD-10-CM

## 2019-06-28 DIAGNOSIS — E538 Deficiency of other specified B group vitamins: Secondary | ICD-10-CM

## 2019-08-13 ENCOUNTER — Telehealth: Payer: Self-pay | Admitting: Physical Therapy

## 2019-08-13 DIAGNOSIS — M5441 Lumbago with sciatica, right side: Secondary | ICD-10-CM

## 2019-08-13 DIAGNOSIS — M48061 Spinal stenosis, lumbar region without neurogenic claudication: Secondary | ICD-10-CM

## 2019-08-13 DIAGNOSIS — G894 Chronic pain syndrome: Secondary | ICD-10-CM

## 2019-08-13 DIAGNOSIS — G8929 Other chronic pain: Secondary | ICD-10-CM

## 2019-08-13 NOTE — Telephone Encounter (Signed)
Copied from [place] [id number]. Topic: Referral - Request for Referral >> Aug 13, 2019  1:26 PM Rayann Heman wrote: YES Referral for which specialty: Pain Management  Preferred provider/office: Jeanella Anton NP-C Phone#[phone] EZB#[phone] [facility] medical center  Reason for referral: back pain

## 2019-08-16 NOTE — Addendum Note (Signed)
Addended by: Layla Barter on: 08/16/2019 04:57 PM   Modules accepted: Orders

## 2019-08-16 NOTE — Telephone Encounter (Signed)
Pt aware referral sent  

## 2019-08-25 DIAGNOSIS — Z792 Long term (current) use of antibiotics: Secondary | ICD-10-CM

## 2019-08-25 DIAGNOSIS — E538 Deficiency of other specified B group vitamins: Secondary | ICD-10-CM

## 2019-08-25 DIAGNOSIS — M5441 Lumbago with sciatica, right side: Secondary | ICD-10-CM

## 2019-08-25 DIAGNOSIS — B9562 Methicillin resistant Staphylococcus aureus infection as the cause of diseases classified elsewhere: Secondary | ICD-10-CM | POA: Diagnosis not present

## 2019-08-25 DIAGNOSIS — E1151 Type 2 diabetes mellitus with diabetic peripheral angiopathy without gangrene: Secondary | ICD-10-CM

## 2019-08-25 DIAGNOSIS — T8142XA Infection following a procedure, deep incisional surgical site, initial encounter: Secondary | ICD-10-CM | POA: Diagnosis not present

## 2019-08-25 DIAGNOSIS — F1120 Opioid dependence, uncomplicated: Secondary | ICD-10-CM

## 2019-08-25 DIAGNOSIS — M4636 Infection of intervertebral disc (pyogenic), lumbar region: Secondary | ICD-10-CM | POA: Diagnosis not present

## 2019-08-25 DIAGNOSIS — M5136 Other intervertebral disc degeneration, lumbar region: Secondary | ICD-10-CM

## 2019-08-25 DIAGNOSIS — Z7982 Long term (current) use of aspirin: Secondary | ICD-10-CM

## 2019-08-25 DIAGNOSIS — J45909 Unspecified asthma, uncomplicated: Secondary | ICD-10-CM

## 2019-08-25 DIAGNOSIS — Z79891 Long term (current) use of opiate analgesic: Secondary | ICD-10-CM

## 2019-08-25 DIAGNOSIS — M4316 Spondylolisthesis, lumbar region: Secondary | ICD-10-CM

## 2019-08-25 DIAGNOSIS — I872 Venous insufficiency (chronic) (peripheral): Secondary | ICD-10-CM

## 2019-08-25 DIAGNOSIS — E1122 Type 2 diabetes mellitus with diabetic chronic kidney disease: Secondary | ICD-10-CM

## 2019-08-25 DIAGNOSIS — I340 Nonrheumatic mitral (valve) insufficiency: Secondary | ICD-10-CM

## 2019-08-25 DIAGNOSIS — M179 Osteoarthritis of knee, unspecified: Secondary | ICD-10-CM

## 2019-08-25 DIAGNOSIS — R32 Unspecified urinary incontinence: Secondary | ICD-10-CM

## 2019-08-25 DIAGNOSIS — K219 Gastro-esophageal reflux disease without esophagitis: Secondary | ICD-10-CM

## 2019-08-25 DIAGNOSIS — M5442 Lumbago with sciatica, left side: Secondary | ICD-10-CM

## 2019-08-25 DIAGNOSIS — G894 Chronic pain syndrome: Secondary | ICD-10-CM

## 2019-08-25 DIAGNOSIS — T8131XA Disruption of external operation (surgical) wound, not elsewhere classified, initial encounter: Secondary | ICD-10-CM

## 2019-08-25 DIAGNOSIS — N183 Chronic kidney disease, stage 3 (moderate): Secondary | ICD-10-CM

## 2019-08-25 DIAGNOSIS — Z96651 Presence of right artificial knee joint: Secondary | ICD-10-CM

## 2019-08-25 DIAGNOSIS — Z9181 History of falling: Secondary | ICD-10-CM

## 2019-08-25 DIAGNOSIS — M47816 Spondylosis without myelopathy or radiculopathy, lumbar region: Secondary | ICD-10-CM

## 2019-08-25 DIAGNOSIS — M19041 Primary osteoarthritis, right hand: Secondary | ICD-10-CM

## 2019-12-01 ENCOUNTER — Telehealth: Payer: Self-pay | Admitting: Family Medicine

## 2019-12-01 NOTE — Telephone Encounter (Signed)
See note  Copied from [place] [id number]. Topic: General - Inquiry >> Nov 30, 2019  5:25 PM Percell Belt A wrote: Reason for CRM: pt called in and would like to know if Dr Jonni Sanger could start back up the home health this coming Saturday?  She stated that she is coming into town Friday and she stated dr Jonni Sanger did this for her before?   Best number [phone] [facility] home health

## 2019-12-01 NOTE — Telephone Encounter (Signed)
Ok to place this referral?  Will patient need OV?

## 2019-12-02 NOTE — Telephone Encounter (Signed)
Please schedule patient for OV for home health start.  Can be virtual.  Also needs in person in Jan for CPE with labs.  Thanks!

## 2019-12-02 NOTE — Telephone Encounter (Signed)
Needs OV. Can be virtual for this. But also will need in person visit in January for physical with labs. Thanks

## 2019-12-02 NOTE — Telephone Encounter (Signed)
Left Voice mail for patient to call us back.

## 2019-12-06 ENCOUNTER — Encounter: Payer: Self-pay | Admitting: Family Medicine

## 2019-12-06 ENCOUNTER — Ambulatory Visit (INDEPENDENT_AMBULATORY_CARE_PROVIDER_SITE_OTHER): Payer: Medicare Other | Admitting: Family Medicine

## 2019-12-06 VITALS — Temp 98.6°F | Wt 194.0 lb

## 2019-12-06 DIAGNOSIS — S21209D Unspecified open wound of unspecified back wall of thorax without penetration into thoracic cavity, subsequent encounter: Secondary | ICD-10-CM | POA: Diagnosis not present

## 2019-12-06 DIAGNOSIS — G8929 Other chronic pain: Secondary | ICD-10-CM

## 2019-12-06 DIAGNOSIS — G894 Chronic pain syndrome: Secondary | ICD-10-CM | POA: Diagnosis not present

## 2019-12-06 DIAGNOSIS — M5441 Lumbago with sciatica, right side: Secondary | ICD-10-CM | POA: Diagnosis not present

## 2019-12-06 DIAGNOSIS — M4636 Infection of intervertebral disc (pyogenic), lumbar region: Secondary | ICD-10-CM

## 2019-12-06 DIAGNOSIS — Z22322 Carrier or suspected carrier of Methicillin resistant Staphylococcus aureus: Secondary | ICD-10-CM

## 2019-12-06 NOTE — Progress Notes (Signed)
Virtual Visit via Video Note  Subjective  CC:  Chief Complaint  Patient presents with  . Referral for homehealth     I connected with Alison Monroe on 12/06/19 at  4:20 PM EST by a video enabled telemedicine application and verified that I am speaking with the correct person using two identifiers. Location patient: Home Location provider: [facility], Office Persons participating in the virtual visit: Alison Monroe, Alison Arnt, MD jasmine neal, CMA  I discussed the limitations of evaluation and management by telemedicine and the availability of in person appointments. The patient expressed understanding and agreed to proceed. HPI: Alison Monroe is a 58 y.o. female who was contacted today to address the problems listed above in the chief complaint. . I have reviewed most recent records from the [facility] care system: ortho, pain, and ID consults.  Alison Monroe is back in [place]; planning on finally have back surgery in January to remove hardware. Need HH skilled nursing for wound care until then.  . Chronic pain:fortunately, has been able to wean down pain medications.  . ID: stable on preventive daily abx. Will stop 2 weeks prior to surgery. No fevers. Wound remains open and draining.  . ? Allergy to metal/titanium. Requests allergy referral for investigation.  Marland Kitchen HM: overdue for mammogram and pap. Defer until wound and back issues are improved.  Assessment  1. Wound of back, unspecified laterality, subsequent encounter   2. Infection of intervertebral disc (pyogenic), lumbar region ([place])   3. Chronic pain syndrome   4. Chronic bilateral low back pain with right-sided sciatica   5. MRSA (methicillin resistant Staphylococcus aureus) colonization      Plan   Agree with home health referral for daily wound care. Refer to allergy to address her questions about possible metal allergy contributing to wound complications.   Will f/u with ID and  Ortho and pain mgt   Will f/u with me after surgery; needs lab review/tsh reassessment.   I discussed the assessment and treatment plan with the patient. The patient was provided an opportunity to ask questions and all were answered. The patient agreed with the plan and demonstrated an understanding of the instructions.   The patient was advised to call back or seek an in-person evaluation if the symptoms worsen or if the condition fails to improve as anticipated. Follow up: 3 month for f/u.   Visit date not found  No orders of the defined types were placed in this encounter.     I reviewed the patients updated PMH, FH, and SocHx.    Patient Active Problem List   Diagnosis Date Noted  . Degeneration of lumbar intervertebral disc 12/17/2018    Priority: High  . Infection of intervertebral disc (pyogenic), lumbar region [facility]) 12/17/2018    Priority: High  . Narcotic dependence ([place]) 12/17/2018    Priority: High  . Peripheral venous insufficiency 12/17/2018    Priority: High  . Prediabetes 12/17/2018    Priority: High  . Chronic kidney disease, stage 3 (moderate) 12/17/2018    Priority: High  . Chronic bilateral low back pain with right-sided sciatica 12/17/2018    Priority: High  . Chronic pain syndrome 12/17/2018    Priority: High  . Asthma without status asthmaticus 05/28/2019    Priority: Medium  . Body mass index (bmi) 32.0-32.9, adult 12/17/2018    Priority: Medium  . Knee osteoarthritis 12/17/2018    Priority: Medium  .  MRSA (methicillin resistant Staphylococcus aureus) colonization 12/17/2018    Priority: Medium  . S/P bariatric surgery 12/17/2018    Priority: Medium  . Fibrocystic breast changes 12/17/2018    Priority: Low  . Gastro-esophageal reflux disease without esophagitis 12/17/2018    Priority: Low  . S/P lumbar fusion 09/11/2016    Priority: Low  . Localized, primary osteoarthritis of hand 05/28/2019  . Mitral valve disorder 05/28/2019  . Loosening of  hardware in spine (HCC) 01/26/2019  . Vitamin B 12 deficiency 12/17/2018  . Lumbar nerve root impingement 12/01/2018  . Lumbar foraminal stenosis 12/01/2018  . Spondylolisthesis of lumbar region 12/01/2018  . Back wound 09/03/2018  . Infection of lumbar spine (HCC) 06/04/2016  . Leukocytosis 05/05/2016  . Status post total right knee replacement 10/19/2015   Current Meds  Medication Sig  . acetaminophen (TYLENOL) 325 MG tablet Take 2 tablets by mouth 2 (two) times daily as needed.  Marland Kitchen aspirin (ASPIRIN ADULT LOW DOSE) 81 MG EC tablet 1 tab(s)  . cyanocobalamin (,VITAMIN B-12,) 1000 MCG/ML injection Inject 1 mL into the skin.  Marland Kitchen diclofenac sodium (VOLTAREN) 1 % GEL Apply topically.  . diphenhydrAMINE (BENADRYL ALLERGY) 25 mg capsule   . fentaNYL (DURAGESIC - DOSED MCG/HR) 100 MCG/HR Place 1 patch onto the skin every other day.  Marland Kitchen HYDROmorphone (DILAUDID) 8 MG tablet Take 1 tablet by mouth every 12 (twelve) hours.   Marland Kitchen omeprazole (PRILOSEC) 20 MG capsule Take 1 capsule by mouth 2 (two) times daily.  . potassium chloride (KLOR-CON) 8 MEQ tablet Take 8 mEq by mouth daily.  Marland Kitchen sulfamethoxazole-trimethoprim (BACTRIM DS,SEPTRA DS) 800-160 MG tablet   . torsemide (DEMADEX) 100 MG tablet Take 1 tablet by mouth daily.    Allergies: Patient is allergic to dextran; erythromycin base; metronidazole; other; penicillins; vancomycin; and adhesive  [tape]. Family History: Patient family history includes Arthritis in her maternal grandmother and mother; Asthma in her maternal grandmother and mother; COPD in her father and mother; Cancer in her maternal grandfather; Depression in her mother; Diabetes in her brother, father, maternal grandmother, and mother; Early death in her father and mother; Heart attack in her father and mother; Heart disease in her brother and mother; Hyperlipidemia in her brother and mother; Hypertension in her brother, father, and mother; Kidney disease in her father; Stroke in her  father. Social History:  Patient  reports that she has never smoked. She has never used smokeless tobacco. She reports that she does not drink alcohol or use drugs.  Review of Systems: Constitutional: Negative for fever malaise or anorexia Cardiovascular: negative for chest pain Respiratory: negative for SOB or persistent cough Gastrointestinal: negative for abdominal pain  OBJECTIVE Vitals: Temp 98.6 F (37 C) (Temporal)   Wt 194 lb (88 kg)   BMI 35.48 kg/m  General: no acute distress , A&Ox3  Willow Ora, MD

## 2019-12-21 ENCOUNTER — Other Ambulatory Visit: Payer: Self-pay

## 2019-12-21 ENCOUNTER — Encounter: Payer: Self-pay | Admitting: Allergy

## 2019-12-21 ENCOUNTER — Ambulatory Visit: Payer: Medicare Other | Admitting: Allergy

## 2019-12-21 VITALS — BP 126/78 | HR 71 | Temp 97.8°F | Resp 18 | Ht 64.0 in | Wt 221.0 lb

## 2019-12-21 DIAGNOSIS — L230 Allergic contact dermatitis due to metals: Secondary | ICD-10-CM | POA: Diagnosis not present

## 2019-12-21 DIAGNOSIS — Z8619 Personal history of other infectious and parasitic diseases: Secondary | ICD-10-CM

## 2019-12-21 DIAGNOSIS — J310 Chronic rhinitis: Secondary | ICD-10-CM

## 2019-12-21 NOTE — Patient Instructions (Signed)
   Return on Wednesday, Friday and Monday at [facility] office for metal patch testing.   Patches once placed should not get wet.  You do not have to stop any medications for patch testing but should not be on oral prednisone. You can schedule a patch testing visit when convenient for your schedule.    . Get bloodwork:  o We are ordering labs, so please allow 1-2 weeks for the results to come back. o With the newly implemented Cures Act, the labs might be visible to you at the same time that they become visible to me. However, I will not address the results until all of the results are back, so please be patient.   Follow up in 2 months via telemedicine.

## 2019-12-21 NOTE — Assessment & Plan Note (Signed)
Perennial rhinitis symptoms and takes benadryl daily with good benefit. Concerned if maybe allergic to cats.  Will get bloodwork for environmental allergy panel to minimize irritation to the skin on the back.

## 2019-12-21 NOTE — Assessment & Plan Note (Signed)
History of multiple surgical site infections s/p surgeries. Denies any frequent URIs. Given her complicated history, I think it would be worthwhile to check bloodwork to look at her immune system.  Keep track of infections.

## 2019-12-21 NOTE — Assessment & Plan Note (Addendum)
Patient has a complicated spinal surgical history involving 20 surgeries since 1997. Now having issues with infections in the back with drainage despite oral antibiotics. No issues with right knee replacement done in 2015. History of contact dermatitis to certain earrings. Patient concerned if she is allergic to the hardware and/or other surgical products that were used during her surgeries. She is scheduled for hardware removal on 12/31/2019 at [facility]. Being followed by ID, neurosurgery and pain medicine.   Discussed with patient that given her complicated history, it would be worthwhile to do patch testing to metals.  I also asked patient to find out if they know what final hardware and products are planned to be used as we could also patch tests for those items in the future.  Plan on metal patch testing this week - on arm instead of back.

## 2019-12-21 NOTE — Progress Notes (Signed)
New Patient Note  RE: Alison Monroe MRN: [id number] DOB: Jun 30, 1961 Date of Office Visit: 12/21/2019  Referring provider: Willow Ora, MD Primary care provider: Willow Ora, MD  Chief Complaint: Open Wound (Back)  History of Present Illness: I had the pleasure of seeing Alison Monroe for initial evaluation at the [facility] on 12/21/2019. She is a 59 y.o. female, who is referred here by Willow Ora, MD for the evaluation of metal allergy.  Patient has a complicated surgical history which involves 20 back surgeries with revision since 1997. Apparently she had issues with healing and infections even back then. Her last back surgery was in 2014 and was doing well up until 2017. Then she developed a rash with elevated temperature. She was treated for MRSA infection with IV antibiotics which helped but then 6 months afterwards she started to have problems again with infections. She is currently on bactrim DS BID for the past 2 years. Currently she has an open wound with sinus tracts and the hardware is visible.   She is scheduled for another back surgery on 12/31/2019 at [facility] and will be removing the hardware due to unhealing wound and infection. The plan is to remove the hardware and let the are heal for a few months and then reinsert the hardware.  Patient is concerned about being allergic to the hardware or any other surgical products that have been used on her.  She first noticed having issues with sutures after her gallbladder surgery which got infected. She also has history of contact dermatitis to certain earrings but is able to wear some earrings with no issues. Gold has never given a problem. She believes the hardware in her back is made of titanium.  Most of her surgeries were done in [place].  She had a right knee replacement in 2015 which did not give her any issues.   No previous metal patch testing.  Patient has been seen by ID, neurosurgery, pain  medicine.   Reviewed notes by ID and ortho.  Assessment and Plan: Alison Monroe is a 58 y.o. 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 Now having issues with infections in the back with drainage despite oral antibiotics. No issues with right knee replacement done in 2015. History of contact dermatitis to certain earrings. Patient concerned if she is allergic to the hardware and/or other surgical products that were used during her surgeries. She is scheduled for hardware removal on 12/31/2019 at [facility]. Being followed by ID, neurosurgery and pain medicine.   Discussed with patient that given her complicated history, it would be worthwhile to do patch testing to metals.  I also asked patient to find out if they know what final hardware and products are planned to be used as we could also patch tests for those items in the future.  Plan on metal patch testing this week - on arm instead of back.   Frequent infections History of multiple surgical site infections s/p surgeries. Denies any frequent URIs. Given her complicated history, I think it would be worthwhile to check bloodwork to look at her immune system.  Keep track of infections.   Chronic rhinitis Perennial rhinitis symptoms and takes benadryl daily with good benefit. Concerned if maybe allergic to cats.  Will get bloodwork for environmental allergy panel to minimize irritation to the skin on the back.   Return in about 2 months (around  02/18/2020).  Lab Orders     Allergens w/Total IgE Area 2     CBC w/Diff/Platelet     Complement, total     Strep pneumoniae 23 Serotypes IgG     IgG, IgA, IgM     Tryptase  Other allergy screening: Asthma: no  Patient had asthma over 30 years ago and no inhaler use since then.  Rhino conjunctivitis: yes  Rhinitis symptoms and she takes benadryl daily with good benefit.  Food allergy: no Medication allergy: yes  See  allergy list Hymenoptera allergy: no Urticaria: no Eczema:no History of recurrent infections suggestive of immunodeficency: no  Patient has no history multiple infections including sinus infection, pneumonia, ear infections, GI infections/diarrhea, skin infections/abscesses. Patient also has no history of opportunistic infections including fungal infections, viral infections. Patient reports multiple antibiotic use in the last 12 months due to her back. Patient does not have any secondary causes of immunodeficiency including chronic steroid use, diabetes mellitus, protein losing enteropathy, hepatic dysfunction, history of cancer or irradiation or history of HIV, hepatitis B or C. Mild elevated Cr.  Diagnostics: None.  Past Medical History: Patient Active Problem List   Diagnosis Date Noted  . Frequent infections 12/21/2019  . Chronic rhinitis 12/21/2019  . 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 Asthma without status asthmaticus 05/28/2019  . Localized, primary osteoarthritis of hand 05/28/2019  . Mitral valve disorder 05/28/2019  . Loosening of hardware in spine (HCC) 01/26/2019  . Body mass index (bmi) 32.0-32.9, adult 12/17/2018  . Degeneration of lumbar intervertebral disc 12/17/2018  . Fibrocystic breast changes 12/17/2018  . Gastro-esophageal reflux disease without esophagitis 12/17/2018  . Infection of intervertebral disc (pyogenic), lumbar region (HCC) 12/17/2018  . Knee osteoarthritis 12/17/2018  . MRSA (methicillin resistant Staphylococcus aureus) colonization 12/17/2018  . Narcotic dependence (HCC) 12/17/2018  . 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 S/P bariatric surgery 12/17/2018  . Prediabetes 12/17/2018  . Chronic kidney disease, stage 3 (moderate) 12/17/2018  . Chronic bilateral low back pain with right-sided sciatica 12/17/2018  . Chronic pain syndrome 12/17/2018  . Vitamin B 12 deficiency 12/17/2018  . Lumbar nerve root impingement 12/01/2018  .  Lumbar foraminal stenosis 12/01/2018  . Spondylolisthesis of lumbar region 12/01/2018  . Back wound 09/03/2018  . S/P lumbar fusion 09/11/2016  . Infection of lumbar spine (HCC) 06/04/2016  . Leukocytosis 05/05/2016  . Status post total right knee replacement 10/19/2015   Past Medical History:  Diagnosis Date  . Arthritis   . Chronic kidney disease    Stage 3  . Diabetes (HCC)   . Heart murmur    Past Surgical History: Past Surgical History:  Procedure Laterality Date  . ANKLE SURGERY Left   . BREAST BIOPSY    . CATARACT EXTRACTION  2015  . GALLBLADDER SURGERY  1981  . GASTRIC BYPASS    . LUMBAR SPINE SURGERY     Several. Rods, bolts and screws in place  . TOTAL KNEE ARTHROPLASTY Right 2015   Medication List:  Current Outpatient Medications  Medication Sig Dispense Refill  . aspirin (ASPIRIN ADULT LOW DOSE) 81 MG EC tablet 1 tab(s)    . baclofen (LIORESAL) 20 MG tablet     . clindamycin (CLEOCIN) 150 MG capsule Take 150 mg by mouth 4 (four) times daily.    . diphenhydrAMINE (BENADRYL ALLERGY) 25 mg capsule     . escitalopram (LEXAPRO) 10 MG tablet Take 10 mg by mouth daily.    . fentaNYL (DURAGESIC - DOSED  MCG/HR) 100 MCG/HR Place 1 patch onto the skin every other day.    Marland Kitchen HYDROmorphone (DILAUDID) 8 MG tablet Take 1 tablet by mouth every 12 (twelve) hours.     Marland Kitchen ibuprofen (ADVIL) 200 MG tablet Take by mouth.    . mupirocin ointment (BACTROBAN) 2 % mupirocin 2 % topical ointment    . sulfamethoxazole-trimethoprim (BACTRIM DS,SEPTRA DS) 800-160 MG tablet     . topiramate (TOPAMAX) 50 MG tablet     . torsemide (DEMADEX) 100 MG tablet Take 1 tablet by mouth daily.  0  . acetaminophen (TYLENOL) 325 MG tablet Take 2 tablets by mouth 2 (two) times daily as needed.    . cyanocobalamin (,VITAMIN B-12,) 1000 MCG/ML injection Inject 1 mL into the skin.    . diazepam (VALIUM) 10 MG tablet Take 1 tablet by mouth 3 (three) times daily.  1  . diclofenac sodium (VOLTAREN) 1 % GEL Apply  topically.    Marland Kitchen omeprazole (PRILOSEC) 20 MG capsule Take 1 capsule by mouth 2 (two) times daily.    . potassium chloride (KLOR-CON) 8 MEQ tablet Take 8 mEq by mouth daily.    . temazepam (RESTORIL) 30 MG capsule Take 1 capsule by mouth at bedtime.     No current facility-administered medications for this visit.   Allergies: Allergies  Allergen Reactions  . Dextran Other (See Comments)  . Erythromycin Other (See Comments)    GI upset  . Erythromycin Base Other (See Comments)  . Metronidazole     Other reaction(s): Abdominal Pain  . Other Swelling    dexon sutures   . Penicillins   . Vancomycin Other (See Comments)    Kidney failure  . Adhesive  [Tape] Rash    Paper tape  . Ampicillin Rash   Social History: Social History   Socioeconomic History  . Marital status: Single    Spouse name: Not on file  . Number of children: Not on file  . Years of education: Not on file  . Highest education level: Not on file  Occupational History  . Occupation: disabled  Tobacco Use  . Smoking status: Never Smoker  . Smokeless tobacco: Never Used  Substance and Sexual Activity  . Alcohol use: Never  . Drug use: Never  . Sexual activity: Not on file  Other Topics Concern  . Not on file  Social History Narrative  . Not on file   Social Determinants of Health   Financial Resource Strain:   . Difficulty of Paying Living Expenses: Not on file  Food Insecurity:   . Worried About Charity fundraiser in the Last Year: Not on file  . Ran Out of Food in the Last Year: Not on file  Transportation Needs:   . Lack of Transportation (Medical): Not on file  . Lack of Transportation (Non-Medical): Not on file  Physical Activity:   . Days of Exercise per Week: Not on file  . Minutes of Exercise per Session: Not on file  Stress:   . Feeling of Stress : Not on file  Social Connections:   . Frequency of Communication with Friends and Family: Not on file  . Frequency of Social Gatherings with  Friends and Family: Not on file  . Attends Religious Services: Not on file  . Active Member of Clubs or Organizations: Not on file  . Attends Archivist Meetings: Not on file  . Marital Status: Not on file   Lives in [place] and visits to [place].  Smoking: denies Occupation: disabled  Environmental HistorySurveyor, minerals in the house: yes Carpet in the family room: no Carpet in the bedroom: no Heating: electric Cooling: central Pet: yes 2 cats x 13 yrs, 2 yrs  Family History: Family History  Problem Relation Age of Onset  . Arthritis Mother   . Asthma Mother   . COPD Mother   . Depression Mother   . Diabetes Mother   . Early death Mother   . Heart disease Mother   . Hyperlipidemia Mother   . Hypertension Mother   . Heart attack Mother   . COPD Father   . Diabetes Father   . Early death Father   . Heart attack Father   . Kidney disease Father   . Hypertension Father   . Stroke Father   . Diabetes Brother   . Heart disease Brother   . Hyperlipidemia Brother   . Hypertension Brother   . Arthritis Maternal Grandmother   . Asthma Maternal Grandmother   . Diabetes Maternal Grandmother   . Cancer Maternal Grandfather    Problem                               Relation Immuno deficiency  none  Review of Systems  Constitutional: Positive for diaphoresis. Negative for appetite change, chills, fever and unexpected weight change.  HENT: Negative for congestion and rhinorrhea.   Eyes: Negative for itching.  Respiratory: Negative for cough, chest tightness, shortness of breath and wheezing.   Cardiovascular: Negative for chest pain.  Gastrointestinal: Negative for abdominal pain.  Genitourinary: Negative for difficulty urinating.  Skin: Positive for rash.  Allergic/Immunologic: Negative for food allergies.  Neurological: Negative for headaches.   Objective: BP 126/78 (BP Location: Right Arm, Patient Position: Sitting, Cuff Size: Large)   Pulse 71   Temp  97.8 F (36.6 C) (Temporal)   Resp 18   Ht 5\' 4"  (1.626 m)   Wt 221 lb (100.2 kg)   SpO2 96%   BMI 37.93 kg/m  Body mass index is 37.93 kg/m. Physical Exam  Constitutional: She is oriented to person, place, and time. She appears well-developed and well-nourished.  HENT:  Head: Normocephalic and atraumatic.  Right Ear: External ear normal.  Left Ear: External ear normal.  Nose: Nose normal.  Mouth/Throat: Oropharynx is clear and moist.  Eyes: Conjunctivae and EOM are normal.  Cardiovascular: Normal rate, regular rhythm and normal heart sounds. Exam reveals no gallop and no friction rub.  No murmur heard. Pulmonary/Chest: Effort normal and breath sounds normal. She has no wheezes. She has no rales.  Abdominal: Soft.  Musculoskeletal:     Cervical back: Neck supple.  Neurological: She is alert and oriented to person, place, and time.  Skin: Skin is warm.  Surgical scars on the back. Packed wound on lower back with greenish/whitish discharge. No significant erythema. No pain with palpation. Not warm to touch.  Psychiatric: She has a normal mood and affect. Her behavior is normal.  Nursing note and vitals reviewed.  The plan was reviewed with the patient/family, and all questions/concerned were addressed.  It was my pleasure to see Alison Monroe today and participate in her care. Please feel free to contact me with any questions or concerns.  Sincerely,  Waynetta Sandy, DO Allergy & Immunology  [facility] office: [phone] [facility] office: [phone] [place] office: [phone]

## 2019-12-22 ENCOUNTER — Encounter: Payer: Self-pay | Admitting: Allergy

## 2019-12-22 ENCOUNTER — Ambulatory Visit (INDEPENDENT_AMBULATORY_CARE_PROVIDER_SITE_OTHER): Payer: Medicare Other | Admitting: Allergy

## 2019-12-22 VITALS — BP 170/82 | HR 94 | Temp 97.6°F | Resp 18 | Ht 64.0 in

## 2019-12-22 DIAGNOSIS — L239 Allergic contact dermatitis, unspecified cause: Secondary | ICD-10-CM

## 2019-12-22 DIAGNOSIS — Z889 Allergy status to unspecified drugs, medicaments and biological substances status: Secondary | ICD-10-CM

## 2019-12-22 NOTE — Patient Instructions (Addendum)
.   Patches placed today. . We will remove the patches on Friday and will do our initial read. . Then you will come back on Monday for a final read.  . Get bloodwork:  o We are ordering labs, so please allow 1-2 weeks for the results to come back. o With the newly implemented Cures Act, the labs might be visible to you at the same time that they become visible to me. However, I will not address the results until all of the results are back, so please be patient.  o In the meantime, continue recommendations in your patient instructions, including avoidance measures (if applicable), until you hear from me.

## 2019-12-22 NOTE — Progress Notes (Signed)
   Follow Up Note  RE: Sukari Grist MRN: [id number] DOB: 1961/07/07 Date of Office Visit: 12/22/2019  Referring provider: Willow Ora, MD Primary care provider: Willow Ora, MD  History of Present Illness: I had the pleasure of seeing Eiman Maret for a follow up visit at the [facility] on 12/22/2019. She is a 59 y.o. female, who is being followed for evaluation of metal allergy. Today she is here for patch test placement to metals, given suspected history of contact dermatitis.   Surgeon will be using titanium and cobalt chromium.   Diagnostics: Metal test patches placed on upper arms b/l.    Assessment and Plan: Sansa is a 59 y.o. female with: Allergic contact dermatitis . Metal patches placed today on upper arms b/l.  Marland Kitchen Please avoid strenuous physical activities and do not get the patches wet. . We will remove the patches on Friday and will do our initial read. . Then you will come back on Monday for a final read.  It was my pleasure to see Tineshia today and participate in her care. Please feel free to contact me with any questions or concerns.  Sincerely,  Wyline Mood, DO Allergy & Immunology  [facility] office: [phone] [facility] office:[phone]

## 2019-12-22 NOTE — Assessment & Plan Note (Signed)
.   Metal patches placed today on upper arms b/l.  Marland Kitchen Please avoid strenuous physical activities and do not get the patches wet. . We will remove the patches on Friday and will do our initial read. . Then you will come back on Monday for a final read.

## 2019-12-23 ENCOUNTER — Telehealth: Payer: Self-pay

## 2019-12-23 NOTE — Telephone Encounter (Signed)
Patient called concern with the weather and her patch testing and was advised if the clinic is still opened to continue to come in depending on severity of the roads and if we decided to shut down due to the weather per Dr. Delorse Lek patient can remove the patches and take a picture of her arms and send it via Mychart so that we can evaluate her arms. Patient will call and speak with Dr. Delorse Lek if she were to have any symptoms concerning her patch testing.

## 2019-12-24 ENCOUNTER — Other Ambulatory Visit: Payer: Self-pay

## 2019-12-24 ENCOUNTER — Ambulatory Visit: Payer: Medicare Other | Admitting: Allergy

## 2019-12-24 ENCOUNTER — Encounter: Payer: Medicare Other | Admitting: Allergy

## 2019-12-24 ENCOUNTER — Encounter: Payer: Self-pay | Admitting: Allergy

## 2019-12-24 DIAGNOSIS — L230 Allergic contact dermatitis due to metals: Secondary | ICD-10-CM

## 2019-12-24 NOTE — Progress Notes (Signed)
    Follow-up Note  RE: Alison Monroe MRN: [id number] DOB: 10/18/1961 Date of Office Visit: 12/24/2019  Primary care provider: Willow Ora, MD Referring provider: Willow Ora, MD   Anaiz returns to the office today for the initial patch test interpretation.     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  Advised her to return on Monday for final reading.  Advised that if she develops any redness or any changes in the appearance of the areas of the patches to take a picture over the weekend.    Return to office on Monday, 12/27/2019 for final reading  Margo Aye, MD [facility]

## 2019-12-25 LAB — LATEX, IGE: Latex IgE: <0.1 kU/L

## 2019-12-27 ENCOUNTER — Telehealth (INDEPENDENT_AMBULATORY_CARE_PROVIDER_SITE_OTHER): Payer: Medicare Other | Admitting: Allergy

## 2019-12-27 ENCOUNTER — Encounter: Payer: Self-pay | Admitting: Allergy

## 2019-12-27 ENCOUNTER — Telehealth: Payer: Self-pay | Admitting: Family Medicine

## 2019-12-27 DIAGNOSIS — Z889 Allergy status to unspecified drugs, medicaments and biological substances status: Secondary | ICD-10-CM | POA: Diagnosis not present

## 2019-12-27 DIAGNOSIS — J310 Chronic rhinitis: Secondary | ICD-10-CM

## 2019-12-27 DIAGNOSIS — Z8619 Personal history of other infectious and parasitic diseases: Secondary | ICD-10-CM | POA: Diagnosis not present

## 2019-12-27 DIAGNOSIS — L239 Allergic contact dermatitis, unspecified cause: Secondary | ICD-10-CM

## 2019-12-27 MED ORDER — AZELASTINE-FLUTICASONE 137-50 MCG/ACT NA SUSP: 1.0000 | Freq: Two times a day (BID) | NASAL | 5 refills | Status: DC

## 2019-12-27 NOTE — Assessment & Plan Note (Addendum)
Past history - Complicated spinal surgical history involving 20 surgeries since 1997. Now having issues with infections in the back with drainage despite oral antibiotics. No issues with right knee replacement done in 2015. History of contact dermatitis to certain earrings. Patient concerned if she is allergic to the hardware and/or other surgical products that were used during her surgeries. She is scheduled for hardware removal on 12/31/2019 at [facility]. Being followed by ID, neurosurgery and pain medicine.   Interim history - Metal patch testing were all negative which is great. Did not test for other non-metal materials that may be used during surgery.   Aluminum Hydroxide 10%  Chromium chloride 1%  Cobalt chloride hexahydrate 1%  Molybdenum chloride 0.5%  Nickel sulfate hexahydrate 5%  Potassium dichromate 0.25%  Copper sulfate pentahydrate 2%  Tantal 1%  Titanium 0.1%  Manganese chloride 0.5%  Vanadium Pentoxide 10%

## 2019-12-27 NOTE — Telephone Encounter (Signed)
Patient called in she stated that she is having surgery on Friday. Patient would like to see if Dr.Andy could get her on the list to get the Vaccine before her surgery since she is Disabled. Patient said she will be in rehab for at least 3 months after her surgery and would like to get it before surgery. Please advise.

## 2019-12-27 NOTE — Patient Instructions (Addendum)
Metal patch testing were all negative which is great. It did not test for other non metal materials that may be used during the surgery though.   I will let you know about the rest of the bloodwork. Keep track of infections.   Try dymista 1 spray twice a day for your sinus symptoms and see if it helps.  Follow up in 2 months via telemedicine.

## 2019-12-27 NOTE — Telephone Encounter (Signed)
Please advise 

## 2019-12-27 NOTE — Assessment & Plan Note (Signed)
Past history - History of multiple surgical site infections s/p surgeries. Denies any frequent URIs.  Interim history - normal IgG, IgM. IgA slightly elevated. Pending other bloodwork.   Keep track of infections.   Awaiting rest of bloodwork.

## 2019-12-27 NOTE — Telephone Encounter (Signed)
Currently, there she is not in the current vaccinating eligible group. She will be next; however, it will likely be weeks to months before this is available to her.   As well, there are no current available vaccination spots open for this week even for the eligible public.   Wish her well on her surgery.   Please go to the following websites for the most up to date Covid Vaccine information:  [email or web address] and [email or web address]   Thanks, Dr. Mardelle Matte

## 2019-12-27 NOTE — Telephone Encounter (Signed)
Patient notified and was given both websites for additional information about covid

## 2019-12-27 NOTE — Progress Notes (Signed)
RE: Alison Monroe MRN: [id number] DOB: Sep 30, 1961 Date of Telemedicine Visit: 12/27/2019  Referring provider: Leamon Arnt, MD Primary care provider: Leamon Arnt, MD  Chief Complaint: Patch Testing   Telemedicine Follow Up Visit via MyChart Video: I connected with Alison Monroe for a follow up on 12/27/19 by MyChart Video and verified that I am speaking with the correct person using two identifiers.   I discussed the limitations, risks, security and privacy concerns of performing an evaluation and management service by telemedicine and the availability of in person appointments. I also discussed with the patient that there may be a patient responsible charge related to this service. The patient expressed understanding and agreed to proceed.  Patient is at home/work. Provider is at the office.  Visit start time: 11:02AM Visit end time: 11:14AM Insurance consent/check in by: front desk Medical consent and medical assistant/nurse: Ashleigh F.  History of Present Illness: She is a 59 y.o. female, who is being followed for evaluation of possible metal allergy and chronic rhinitis. Her previous allergy office visit was on 12/24/2019 with Dr. Nelva Bush. Today is a final metal patch test reading.  Patient asked for mychart visit instead of in office visit as she is having surgery on Friday on her back. She sent pictures of her arms where the patches were and no erythema or skin changes. Patient does not feel anything on her arms either.  Reviewed bloodwork and patient surprised by negative environmental allergy panel as she has sneezing and PND. Not using any daily nasal sprays.    Assessment and Plan: Zavannah is a 59 y.o. female with: Allergic contact dermatitis Past history - Complicated spinal surgical history involving 20 surgeries since 1997. Now having issues with infections in the back with drainage despite oral antibiotics. No issues with right knee replacement done in 2015.  History of contact dermatitis to certain earrings. Patient concerned if she is allergic to the hardware and/or other surgical products that were used during her surgeries. She is scheduled for hardware removal on 12/31/2019 at [facility]. Being followed by ID, neurosurgery and pain medicine.   Interim history - Metal patch testing were all negative which is great. Did not test for other non-metal materials that may be used during surgery.   Aluminum Hydroxide 10%  Chromium chloride 1%  Cobalt chloride hexahydrate 1%  Molybdenum chloride 0.5%  Nickel sulfate hexahydrate 5%  Potassium dichromate 0.25%  Copper sulfate pentahydrate 2%  Tantal 1%  Titanium 0.1%  Manganese chloride 0.5%  Vanadium Pentoxide 10%    Frequent infections Past history - History of multiple surgical site infections s/p surgeries. Denies any frequent URIs.  Interim history - normal IgG, IgM. IgA slightly elevated. Pending other bloodwork.   Keep track of infections.   Awaiting rest of bloodwork.   Chronic rhinitis Past history - Perennial rhinitis symptoms and takes benadryl daily with good benefit. Concerned if maybe allergic to cats. Interim history - negative environmental allergy panel.   Try dymista 1 spray twice a day for sinus symptoms and see if it helps.  If still symptomatic, consider skin prick testing after surgery in the future.   Return in about 2 months (around 02/24/2020).  Meds ordered this encounter  Medications  . Azelastine-Fluticasone 137-50 MCG/ACT SUSP    Sig: Place 1 spray into the nose 2 (two) times daily.    Dispense:  23 g    Refill:  5   Diagnostics: Metals Patch  Aluminum Hydroxide 10%  0  Chromium chloride 1%  0          Cobalt chloride hexahydrate 1%  0          Molybdenum chloride 0.5%  0          Nickel sulfate hexahydrate 5%  0          Potassium dichromate 0.25%  0          Copper sulfate pentahydrate 2%  0          Tantal 1%  0          Titanium 0.1%  0           Manganese chloride 0.5%  0          Vanadium Pentoxide 10%  0            Medication List:  Current Outpatient Medications  Medication Sig Dispense Refill  . acetaminophen (TYLENOL) 325 MG tablet Take 2 tablets by mouth 2 (two) times daily as needed.    Marland Kitchen aspirin (ASPIRIN ADULT LOW DOSE) 81 MG EC tablet 1 tab(s)    . baclofen (LIORESAL) 20 MG tablet     . clindamycin (CLEOCIN) 150 MG capsule Take 150 mg by mouth 4 (four) times daily.    . cyanocobalamin (,VITAMIN B-12,) 1000 MCG/ML injection Inject 1 mL into the skin.    Marland Kitchen diclofenac sodium (VOLTAREN) 1 % GEL Apply topically.    . diclofenac Sodium (VOLTAREN) 1 % GEL     . diphenhydrAMINE (BENADRYL ALLERGY) 25 mg capsule     . escitalopram (LEXAPRO) 10 MG tablet Take 10 mg by mouth daily.    . fentaNYL (DURAGESIC - DOSED MCG/HR) 100 MCG/HR Place 1 patch onto the skin every other day.    Marland Kitchen HYDROmorphone (DILAUDID) 8 MG tablet Take 1 tablet by mouth every 12 (twelve) hours.     Marland Kitchen ibuprofen (ADVIL) 200 MG tablet Take by mouth.    . mupirocin ointment (BACTROBAN) 2 % mupirocin 2 % topical ointment    . omeprazole (PRILOSEC) 20 MG capsule Take 1 capsule by mouth 2 (two) times daily.    . potassium chloride (KLOR-CON) 8 MEQ tablet Take 8 mEq by mouth daily.    Marland Kitchen sulfamethoxazole-trimethoprim (BACTRIM DS,SEPTRA DS) 800-160 MG tablet     . torsemide (DEMADEX) 100 MG tablet Take 1 tablet by mouth daily.  0  . Azelastine-Fluticasone 137-50 MCG/ACT SUSP Place 1 spray into the nose 2 (two) times daily. 23 g 5   No current facility-administered medications for this visit.   Allergies: Allergies  Allergen Reactions  . Dextran Other (See Comments)  . Erythromycin Other (See Comments)    GI upset  . Erythromycin Base Other (See Comments)  . Metronidazole     Other reaction(s): Abdominal Pain  . Other Swelling    dexon sutures   . Penicillins   . Vancomycin Other (See Comments)    Kidney failure  . Adhesive  [Tape] Rash    Paper tape  .  Ampicillin Rash   I reviewed her past medical history, social history, family history, and environmental history and no significant changes have been reported from her previous visit.  Review of Systems  Constitutional: Positive for diaphoresis. Negative for appetite change, chills, fever and unexpected weight change.  HENT: Positive for postnasal drip and sneezing. Negative for congestion and rhinorrhea.   Eyes: Negative for itching.  Respiratory: Negative for cough, chest tightness, shortness of breath and wheezing.   Cardiovascular: Negative  for chest pain.  Gastrointestinal: Negative for abdominal pain.  Genitourinary: Negative for difficulty urinating.  Skin: Positive for rash.  Allergic/Immunologic: Negative for environmental allergies and food allergies.  Neurological: Negative for headaches.   Objective: Physical Exam Not obtained as encounter was done via MyChart Video.  Previous notes and tests were reviewed.  I discussed the assessment and treatment plan with the patient. The patient was provided an opportunity to ask questions and all were answered. The patient agreed with the plan and demonstrated an understanding of the instructions. After visit summary/patient instructions available via mychart.   The patient was advised to call back or seek an in-person evaluation if the symptoms worsen or if the condition fails to improve as anticipated.  I provided 12 minutes of video-face-to-face time during this encounter.  It was my pleasure to participate in [place] care today. Please feel free to contact me with any questions or concerns.   Sincerely,  Wyline Mood, DO Allergy & Immunology  [facility] office: [phone] [facility] office: [phone] [place] office: [phone]

## 2019-12-27 NOTE — Assessment & Plan Note (Signed)
Past history - Perennial rhinitis symptoms and takes benadryl daily with good benefit. Concerned if maybe allergic to cats. Interim history - negative environmental allergy panel.   Try dymista 1 spray twice a day for sinus symptoms and see if it helps.  If still symptomatic, consider skin prick testing after surgery in the future.

## 2019-12-30 LAB — STREP PNEUMONIAE 23 SEROTYPES IGG
Pneumo Ab Type 1*: 1.4 ug/mL (ref >1.3)
Pneumo Ab Type 12 (12F)*: 0.1 ug/mL — ABNORMAL LOW (ref >1.3)
Pneumo Ab Type 14*: >29.1 ug/mL (ref >1.3)
Pneumo Ab Type 17 (17F)*: 2 ug/mL (ref >1.3)
Pneumo Ab Type 19 (19F)*: 14.3 ug/mL (ref >1.3)
Pneumo Ab Type 2*: 1.2 ug/mL — ABNORMAL LOW (ref >1.3)
Pneumo Ab Type 20*: 2.3 ug/mL (ref >1.3)
Pneumo Ab Type 22 (22F)*: 1.7 ug/mL (ref >1.3)
Pneumo Ab Type 23 (23F)*: 0.3 ug/mL — ABNORMAL LOW (ref >1.3)
Pneumo Ab Type 26 (6B)*: 10.1 ug/mL (ref >1.3)
Pneumo Ab Type 3*: 5.6 ug/mL (ref >1.3)
Pneumo Ab Type 34 (10A)*: 10.7 ug/mL (ref >1.3)
Pneumo Ab Type 4*: 0.9 ug/mL — ABNORMAL LOW (ref >1.3)
Pneumo Ab Type 43 (11A)*: 0.4 ug/mL — ABNORMAL LOW (ref >1.3)
Pneumo Ab Type 5*: 7.2 ug/mL (ref >1.3)
Pneumo Ab Type 51 (7F)*: 1.5 ug/mL (ref >1.3)
Pneumo Ab Type 54 (15B)*: 5.9 ug/mL (ref >1.3)
Pneumo Ab Type 56 (18C)*: 0.6 ug/mL — ABNORMAL LOW (ref >1.3)
Pneumo Ab Type 57 (19A)*: 8.4 ug/mL (ref >1.3)
Pneumo Ab Type 68 (9V)*: 0.6 ug/mL — ABNORMAL LOW (ref >1.3)
Pneumo Ab Type 70 (33F)*: 4.3 ug/mL (ref >1.3)
Pneumo Ab Type 8*: 1.8 ug/mL (ref >1.3)
Pneumo Ab Type 9 (9N)*: 1.1 ug/mL — ABNORMAL LOW (ref >1.3)

## 2019-12-30 LAB — ALLERGENS W/TOTAL IGE AREA 2

## 2019-12-30 LAB — CBC WITH DIFFERENTIAL/PLATELET
Basophils Absolute: 0 10*3/uL (ref 0.0–0.2)
Basos: 0 %
EOS (ABSOLUTE): 0.1 10*3/uL (ref 0.0–0.4)
Eos: 2 %
Hematocrit: 37.2 % (ref 34.0–46.6)
Hemoglobin: 11.4 g/dL (ref 11.1–15.9)
Immature Grans (Abs): 0 10*3/uL (ref 0.0–0.1)
Immature Granulocytes: 0 %
Lymphocytes Absolute: 0.9 10*3/uL (ref 0.7–3.1)
Lymphs: 12 %
MCH: 25.2 pg — ABNORMAL LOW (ref 26.6–33.0)
MCHC: 30.6 g/dL — ABNORMAL LOW (ref 31.5–35.7)
MCV: 82 fL (ref 79–97)
Monocytes Absolute: 0.4 10*3/uL (ref 0.1–0.9)
Monocytes: 6 %
Neutrophils Absolute: 5.7 10*3/uL (ref 1.4–7.0)
Neutrophils: 80 %
Platelets: 330 10*3/uL (ref 150–450)
RBC: 4.52 x10E6/uL (ref 3.77–5.28)
RDW: 16.1 % — ABNORMAL HIGH (ref 11.7–15.4)
WBC: 7.2 10*3/uL (ref 3.4–10.8)

## 2019-12-30 LAB — IGG, IGA, IGM
IgA/Immunoglobulin A, Serum: 484 mg/dL — ABNORMAL HIGH (ref 87–352)
IgG (Immunoglobin G), Serum: 1206 mg/dL (ref 586–1602)
IgM (Immunoglobulin M), Srm: 60 mg/dL (ref 26–217)

## 2019-12-30 LAB — COMPLEMENT, TOTAL: Compl, Total (CH50): >60 U/mL (ref >41)

## 2019-12-30 LAB — TRYPTASE: Tryptase: 10.8 ug/L (ref 2.2–13.2)

## 2019-12-31 DIAGNOSIS — M4325 Fusion of spine, thoracolumbar region: Secondary | ICD-10-CM | POA: Insufficient documentation

## 2020-02-09 ENCOUNTER — Telehealth: Payer: Self-pay | Admitting: Family Medicine

## 2020-02-09 NOTE — Telephone Encounter (Signed)
I still have not received any orders form Home Health in regards to Alison Monroe

## 2020-02-09 NOTE — Telephone Encounter (Signed)
Alison Monroe from [facility] called. Pt is in hospital at [facility]. Pt is getting discharged today. Informed that orders are being sent over via fax and asked if Dr. Mardelle Matte would sign.

## 2020-02-12 DIAGNOSIS — I5189 Other ill-defined heart diseases: Secondary | ICD-10-CM | POA: Insufficient documentation

## 2020-02-14 ENCOUNTER — Telehealth: Payer: Self-pay | Admitting: Family Medicine

## 2020-02-14 MED ORDER — MULTI-VITAMIN PO TABS
1.00 | ORAL_TABLET | ORAL | Status: DC
Start: 2020-02-18 — End: 2020-02-14

## 2020-02-14 MED ORDER — TORSEMIDE 20 MG PO TABS
20.00 | ORAL_TABLET | ORAL | Status: DC
Start: 2020-02-17 — End: 2020-02-14

## 2020-02-14 MED ORDER — APIXABAN 5 MG PO TABS
5.00 | ORAL_TABLET | ORAL | Status: DC
Start: 2020-02-17 — End: 2020-02-14

## 2020-02-14 MED ORDER — BACLOFEN 10 MG PO TABS
20.00 | ORAL_TABLET | ORAL | Status: DC
Start: 2020-02-17 — End: 2020-02-14

## 2020-02-14 MED ORDER — POLYETHYLENE GLYCOL 3350 17 GM/SCOOP PO POWD
17.00 | ORAL | Status: DC
Start: ? — End: 2020-02-14

## 2020-02-14 MED ORDER — SENNOSIDES-DOCUSATE SODIUM 8.6-50 MG PO TABS
2.00 | ORAL_TABLET | ORAL | Status: DC
Start: ? — End: 2020-02-14

## 2020-02-14 MED ORDER — FERROUS SULFATE 324 MG PO TBEC
324.00 | DELAYED_RELEASE_TABLET | ORAL | Status: DC
Start: 2020-02-18 — End: 2020-02-14

## 2020-02-14 MED ORDER — GENERIC EXTERNAL MEDICATION
Status: DC
Start: ? — End: 2020-02-14

## 2020-02-14 MED ORDER — ONDANSETRON 4 MG PO TBDP
4.00 | ORAL_TABLET | ORAL | Status: DC
Start: ? — End: 2020-02-14

## 2020-02-14 MED ORDER — LIDOCAINE 5 % EX PTCH
2.00 | MEDICATED_PATCH | CUTANEOUS | Status: DC
Start: 2020-02-18 — End: 2020-02-14

## 2020-02-14 MED ORDER — ACETAMINOPHEN 325 MG PO TABS
975.00 | ORAL_TABLET | ORAL | Status: DC
Start: ? — End: 2020-02-14

## 2020-02-14 MED ORDER — FENTANYL 100 MCG/HR TD PT72
1.00 | MEDICATED_PATCH | TRANSDERMAL | Status: DC
Start: 2020-02-18 — End: 2020-02-14

## 2020-02-14 MED ORDER — GENERIC EXTERNAL MEDICATION
2.00 | Status: DC
Start: 2020-02-16 — End: 2020-02-14

## 2020-02-14 MED ORDER — CALCIUM CARBONATE ANTACID 750 MG PO CHEW
CHEWABLE_TABLET | ORAL | Status: DC
Start: ? — End: 2020-02-14

## 2020-02-14 MED ORDER — HYDROMORPHONE HCL 4 MG PO TABS
8.00 | ORAL_TABLET | ORAL | Status: DC
Start: ? — End: 2020-02-14

## 2020-02-14 MED ORDER — ESCITALOPRAM OXALATE 10 MG PO TABS
10.00 | ORAL_TABLET | ORAL | Status: DC
Start: 2020-02-18 — End: 2020-02-14

## 2020-02-14 MED ORDER — LIDOCAINE HCL 1 % IJ SOLN
0.50 | INTRAMUSCULAR | Status: DC
Start: ? — End: 2020-02-14

## 2020-02-14 MED ORDER — GENERIC EXTERNAL MEDICATION
1.25 | Status: DC
Start: 2020-02-16 — End: 2020-02-14

## 2020-02-14 MED ORDER — CHOLECALCIFEROL 1.25 MG (50000 UT) PO CAPS
50000.00 | ORAL_CAPSULE | ORAL | Status: DC
Start: 2020-02-18 — End: 2020-02-14

## 2020-02-14 MED ORDER — ASPIRIN 81 MG PO TBEC
81.00 | DELAYED_RELEASE_TABLET | ORAL | Status: DC
Start: 2020-02-18 — End: 2020-02-14

## 2020-02-14 MED ORDER — DIPHENHYDRAMINE HCL 25 MG PO CAPS
50.00 | ORAL_CAPSULE | ORAL | Status: DC
Start: ? — End: 2020-02-14

## 2020-02-14 MED ORDER — CAMPHOR-MENTHOL 0.5-0.5 % EX LOTN
TOPICAL_LOTION | CUTANEOUS | Status: DC
Start: ? — End: 2020-02-14

## 2020-02-14 NOTE — Telephone Encounter (Signed)
Patient called in and said she needed a referral put in for Dr. Cyndie Chime  at [facility].

## 2020-02-15 ENCOUNTER — Other Ambulatory Visit: Payer: Self-pay

## 2020-02-15 DIAGNOSIS — I059 Rheumatic mitral valve disease, unspecified: Secondary | ICD-10-CM

## 2020-02-15 NOTE — Telephone Encounter (Signed)
Ok to place referral. Thanks.  

## 2020-02-15 NOTE — Telephone Encounter (Signed)
yes

## 2020-02-15 NOTE — Telephone Encounter (Signed)
Referral placed for [facility] for Dr. Cyndie Chime

## 2020-02-17 MED ORDER — TORSEMIDE 20 MG PO TABS
10.00 | ORAL_TABLET | ORAL | Status: DC
Start: 2020-02-18 — End: 2020-02-17

## 2020-02-21 ENCOUNTER — Telehealth: Payer: Self-pay

## 2020-02-21 ENCOUNTER — Encounter: Payer: Self-pay | Admitting: Family Medicine

## 2020-02-21 ENCOUNTER — Other Ambulatory Visit: Payer: Self-pay

## 2020-02-21 ENCOUNTER — Ambulatory Visit (INDEPENDENT_AMBULATORY_CARE_PROVIDER_SITE_OTHER): Payer: Medicare Other | Admitting: Family Medicine

## 2020-02-21 VITALS — BP 132/74 | HR 84 | Temp 98.0°F | Ht 64.0 in | Wt 235.2 lb

## 2020-02-21 DIAGNOSIS — R7303 Prediabetes: Secondary | ICD-10-CM | POA: Diagnosis not present

## 2020-02-21 DIAGNOSIS — S21209D Unspecified open wound of unspecified back wall of thorax without penetration into thoracic cavity, subsequent encounter: Secondary | ICD-10-CM

## 2020-02-21 DIAGNOSIS — N183 Chronic kidney disease, stage 3 unspecified: Secondary | ICD-10-CM | POA: Diagnosis not present

## 2020-02-21 DIAGNOSIS — F1120 Opioid dependence, uncomplicated: Secondary | ICD-10-CM

## 2020-02-21 DIAGNOSIS — D649 Anemia, unspecified: Secondary | ICD-10-CM

## 2020-02-21 DIAGNOSIS — I4891 Unspecified atrial fibrillation: Secondary | ICD-10-CM

## 2020-02-21 DIAGNOSIS — G894 Chronic pain syndrome: Secondary | ICD-10-CM

## 2020-02-21 DIAGNOSIS — I5189 Other ill-defined heart diseases: Secondary | ICD-10-CM

## 2020-02-21 DIAGNOSIS — Z7901 Long term (current) use of anticoagulants: Secondary | ICD-10-CM

## 2020-02-21 DIAGNOSIS — I872 Venous insufficiency (chronic) (peripheral): Secondary | ICD-10-CM

## 2020-02-21 NOTE — Telephone Encounter (Signed)
Pt scheduled 02/21/2020 to see Dr. Mardelle Matte

## 2020-02-21 NOTE — Patient Instructions (Addendum)
Please return in 3 months for diabetes, kidney and anemia follow up  I have placed a home health wound care referral to help with packing and dressing changes.  F/u with surgeon as directed   Monitor your diet to try to bring down your sugars. At next visit, if levels remain high we may need to discuss medications and cholesterol management.   If you have any questions or concerns, please don't hesitate to send me a message via MyChart or call the office at [phone]. Thank you for visiting with Alison Monroe today! It's our pleasure caring for you.

## 2020-02-21 NOTE — Telephone Encounter (Signed)
Patient is concerned about some packing her back. Patient want to know would Dr. Mardelle Matte take this out during her appt today. Patient states that she was unaware of packing in back until her sister helped her.Pt would like a call back before her appt today.

## 2020-02-21 NOTE — Progress Notes (Signed)
Subjective  CC:  Chief Complaint  Patient presents with  . Hospitalization Follow-up    Seen at [facility] ED. 2/15 confusion and mental altered status, tachycardia, and blood clot in heart and wound in back    HPI: Alison Monroe is a 59 y.o. female who presents to the office today to address the problems listed above in the chief complaint. Hospital transition care visit: admitted 2/15 and d/cd 3/05: this was a readmission after lumbar fusion hospitalization done in January due to AMS.I've personally reviewed recent office visit notes, hospital notes, associated labs and imaging reports and/or pertinent outside office records via chart review or CareEverywhere. Briefly, admitted due to AMS that cleared spontaneously after 48 hours and thought to be medication related. Brain imaging and lab work did not reveal acute cause. 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 She was rate controlled and discharge on eloquis.  Overall she is doing well. Mentation remains clear. A few mind altering meds were stopped; she has h/o polypharmacy.  Chronic pain is controlled on chronic narcotics per pain mgt.  Wound care: she is concerned that she is has "left over" packing in wound. Her sister is doing her dressing changes.  She has chornic leg edema but diuretic dose was decreased due to CKD; she has increased swelling on left.  H/o prediabetes with most recent a1c at 6.6 now. Reports she will eat better and correct this.  Anemia due to blood loss, surgery, iron deficiency and mild ckd.    Assessment  1. Chronic pain syndrome   2. Atrial fibrillation, unspecified type (HCC)   3. Prediabetes   4. Stage 3 chronic kidney disease, unspecified whether stage 3a or 3b CKD   5. Wound of back, unspecified laterality, subsequent encounter   6. Peripheral venous insufficiency   7. Narcotic dependence (HCC)   8. Right atrial mass   9. Anticoagulated   10.  Anemia, unspecified type      Plan   Chronic pain:  Per pain mgt  afib on eloquis with atrial mass: has f/u with Dr. Rennis Golden and will need repeat echo. She is in sinus right now. Remain on eloquis  Wound, back: cultures negative to date. Wound is packed and clean. Will have home health nurse come for dressing changes. Referral placed.   Continue low dose diuretic. May take an extra dose as needed.   Ckd: will recheck in 3 months  Diabetes: eat better; recheck in 3 months.   On abx.   Follow up: Return in about 3 months (around 05/23/2020) for follow up Diabetes kidney and anemia.  Visit date not found  Orders Placed This Encounter  Procedures  . Ambulatory referral to Home Health   No orders of the defined types were placed in this encounter.     I reviewed the patients updated PMH, FH, and SocHx.    Patient Active Problem List   Diagnosis Date Noted  . Degeneration of lumbar intervertebral disc 12/17/2018    Priority: High  . Infection of intervertebral disc (pyogenic), lumbar region [facility]) 12/17/2018    Priority: High  . Narcotic dependence (HCC) 12/17/2018    Priority: High  . Peripheral venous insufficiency 12/17/2018    Priority: High  . Prediabetes 12/17/2018    Priority: High  . Chronic kidney disease, stage 3 unspecified 12/17/2018    Priority: High  . 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 Chronic pain syndrome 12/17/2018    Priority: High  . Knee osteoarthritis 12/17/2018    Priority: Medium  . MRSA (methicillin resistant Staphylococcus aureus) colonization 12/17/2018    Priority: Medium  . S/P bariatric surgery 12/17/2018    Priority: Medium  . Fibrocystic breast changes 12/17/2018    Priority: Low  . Gastro-esophageal reflux disease without esophagitis 12/17/2018    Priority: Low  . S/P lumbar fusion 09/11/2016    Priority: Low  . Right atrial mass 02/12/2020  . Fusion of spine, thoracolumbar  region 12/31/2019  . Multiple drug allergies 12/27/2019  . Allergic contact dermatitis 12/22/2019  . Chronic rhinitis 12/21/2019  . Trigger finger of left hand 06/21/2019  . Localized, primary osteoarthritis of hand 05/28/2019  . Loosening of hardware in spine (HCC) 01/26/2019  . Vitamin B 12 deficiency 12/17/2018  . Lumbar nerve root impingement 12/01/2018  . Lumbar foraminal stenosis 12/01/2018  . Spondylolisthesis of lumbar region 12/01/2018  . Back wound 09/03/2018  . Infection of lumbar spine (HCC) 06/04/2016  . Leukocytosis 05/05/2016  . Status post total right knee replacement 10/19/2015   Current Meds  Medication Sig  . acetaminophen (TYLENOL) 325 MG tablet Take 2 tablets by mouth 2 (two) times daily as needed.  Marland Kitchen apixaban (ELIQUIS) 5 MG TABS tablet Take 5 mg by mouth 2 (two) times daily.  . baclofen (LIORESAL) 20 MG tablet   . Cholecalciferol 1.25 MG (50000 UT) capsule Take 1 capsule by mouth once a week.  . clindamycin (CLEOCIN) 150 MG capsule Take 150 mg by mouth 4 (four) times daily.  . cyanocobalamin (,VITAMIN B-12,) 1000 MCG/ML injection Inject 1 mL into the skin.  Marland Kitchen diclofenac sodium (VOLTAREN) 1 % GEL Apply topically.  . diphenhydrAMINE (BENADRYL ALLERGY) 25 mg capsule   . escitalopram (LEXAPRO) 10 MG tablet Take 10 mg by mouth daily.  . fentaNYL (DURAGESIC - DOSED MCG/HR) 100 MCG/HR Place 1 patch onto the skin every other day.  Marland Kitchen HYDROmorphone (DILAUDID) 8 MG tablet Take 1 tablet by mouth every 12 (twelve) hours.   Marland Kitchen ibuprofen (ADVIL) 200 MG tablet Take by mouth.  . mupirocin ointment (BACTROBAN) 2 % mupirocin 2 % topical ointment  . potassium chloride (KLOR-CON) 8 MEQ tablet Take 8 mEq by mouth daily.  Marland Kitchen sulfamethoxazole-trimethoprim (BACTRIM DS,SEPTRA DS) 800-160 MG tablet   . torsemide (DEMADEX) 20 MG tablet Take 1 tablet by mouth daily.     Allergies: Patient is allergic to dextran; erythromycin; erythromycin base; metronidazole; other; penicillins;  vancomycin; adhesive  [tape]; and ampicillin. Family History: Patient family history includes Arthritis in her maternal grandmother and mother; Asthma in her maternal grandmother and mother; COPD in her father and mother; Cancer in her maternal grandfather; Depression in her mother; Diabetes in her brother, father, maternal grandmother, and mother; Early death in her father and mother; Heart attack in her father and mother; Heart disease in her brother and mother; Hyperlipidemia in her brother and mother; Hypertension in her brother, father, and mother; Kidney disease in her father; Stroke in her father. Social History:  Patient  reports that she has never smoked. She has never used smokeless tobacco. She reports that she does not drink alcohol or use drugs.  Review of Systems: Constitutional: Negative for fever malaise or anorexia Cardiovascular: negative for chest pain Respiratory: negative for SOB or persistent cough Gastrointestinal: negative for abdominal pain  Objective  Vitals: BP 132/74 (BP Location: Left Arm, Patient Position: Sitting, Cuff Size:  Large)   Pulse 84   Temp 98 F (36.7 C) (Temporal)   Ht 5\' 4"  (1.626 m)   Wt 235 lb 3.2 oz (106.7 kg)   SpO2 97%   BMI 40.37 kg/m  General: no acute distress , A&Ox3, looks good HEENT: PEERL, conjunctiva normal,  Cardiovascular:  RRR without murmur or gallop.  Respiratory:  Good breath sounds bilaterally, CTAB with normal respiratory effort Skin:  Warm, no rashes Back wound: packing in place with some drainage. No redness     Commons side effects, risks, benefits, and alternatives for medications and treatment plan prescribed today were discussed, and the patient expressed understanding of the given instructions. Patient is instructed to call or message via MyChart if he/she has any questions or concerns regarding our treatment plan. No barriers to understanding were identified. We discussed Red Flag symptoms and signs in detail.  Patient expressed understanding regarding what to do in case of urgent or emergency type symptoms.   Medication list was reconciled, printed and provided to the patient in AVS. Patient instructions and summary information was reviewed with the patient as documented in the AVS. This note was prepared with assistance of Dragon voice recognition software. Occasional wrong-word or sound-a-like substitutions may have occurred due to the inherent limitations of voice recognition software  This visit occurred during the SARS-CoV-2 public health emergency.  Safety protocols were in place, including screening questions prior to the visit, additional usage of staff PPE, and extensive cleaning of exam room while observing appropriate contact time as indicated for disinfecting solutions.

## 2020-02-24 ENCOUNTER — Ambulatory Visit: Payer: Medicare Other | Admitting: Allergy

## 2020-02-28 ENCOUNTER — Telehealth: Payer: Self-pay | Admitting: Family Medicine

## 2020-02-28 ENCOUNTER — Inpatient Hospital Stay: Payer: Medicare Other | Admitting: Family Medicine

## 2020-02-28 NOTE — Telephone Encounter (Signed)
Lillia Abed from [employer] was calling about wound care for the patient. Please callher at [phone].

## 2020-02-28 NOTE — Telephone Encounter (Signed)
Returned Lindsay's call from [employer]. She wanted to know the measurements of patient's back wound. She was given pt's infectious disease information to get this information.

## 2020-02-29 NOTE — Telephone Encounter (Signed)
Patient called in wanting Dr.Andy know that she is having to take 3 packets in order to change the wound and that the wounds are getting larger.

## 2020-02-29 NOTE — Telephone Encounter (Signed)
See below

## 2020-03-01 NOTE — Telephone Encounter (Signed)
LVM for patient to return call. 

## 2020-03-01 NOTE — Telephone Encounter (Signed)
Who is caring for her wound? Home health? Needs f/u with surgeon if worsening again. She should make an appt.

## 2020-03-02 ENCOUNTER — Telehealth: Payer: Self-pay | Admitting: Family Medicine

## 2020-03-02 NOTE — Telephone Encounter (Signed)
[facility] is calling and stating the patients pain score:7-8, was told to notify Mardelle Matte if it was over 5

## 2020-03-02 NOTE — Telephone Encounter (Signed)
See below

## 2020-03-03 ENCOUNTER — Telehealth: Payer: Self-pay | Admitting: Family Medicine

## 2020-03-03 NOTE — Telephone Encounter (Signed)
Called pt. Unable to leave vm. Phone hung up

## 2020-03-03 NOTE — Telephone Encounter (Signed)
Patient called in saying that Alison Monroe from [facility] has measured the wounds on her right side and has faxed over some paperwork for proper wound care,[facility] has one box left and was wondering if Dr.Andy could sign off on the paperwork so she is able to get more.   Measurements of wound: 1x2 by 2x21/2 -- 1x1 by 2x21/2

## 2020-03-03 NOTE — Telephone Encounter (Signed)
Noted. Patient is on pain meds per pain mgt. I cannot adjust her medications. She can contact her surgeon or back doctors if needed

## 2020-03-03 NOTE — Telephone Encounter (Signed)
Called pt. Unable to leave vm. Phone hung up 

## 2020-03-06 NOTE — Telephone Encounter (Signed)
Please advise 

## 2020-03-06 NOTE — Telephone Encounter (Signed)
I can sign things but will not be able to manage her wound care; should be managed by her surgeon or by whoever was managing before since it has been there for a long time. Thanks.

## 2020-03-06 NOTE — Telephone Encounter (Signed)
FYI ca you call or have you seen ppw

## 2020-03-07 ENCOUNTER — Other Ambulatory Visit: Payer: Self-pay

## 2020-03-07 ENCOUNTER — Telehealth: Payer: Self-pay

## 2020-03-07 DIAGNOSIS — E2839 Other primary ovarian failure: Secondary | ICD-10-CM

## 2020-03-07 NOTE — Telephone Encounter (Signed)
Form signed by Dr. Mardelle Matte and faxed to [facility]. Patient notified

## 2020-03-07 NOTE — Telephone Encounter (Signed)
[facility] is caring for her wound, she is following up with her surgeon and has an upcoming appointment with him.

## 2020-03-07 NOTE — Telephone Encounter (Signed)
Patient is following up with her surgeon and does have an upcoming appointment with him.

## 2020-03-07 NOTE — Telephone Encounter (Signed)
Bone density order placed at the breast center

## 2020-03-07 NOTE — Telephone Encounter (Signed)
Patient notified to allow 1-2 weeks for this appointment.

## 2020-03-07 NOTE — Telephone Encounter (Signed)
Patient is calling because her spine surgeon( Dr. Cynda Familia )  informed her that she would need a bone destiny test. Pt would like to know where would she need to go in order to have this completed.Please call patient return patient call as soon as possible. Pt requesting a call back within the next hour.

## 2020-03-07 NOTE — Telephone Encounter (Signed)
Please advise 

## 2020-03-07 NOTE — Telephone Encounter (Signed)
Please schedule bone density test at breast center. Thanks. Dx: hypoestrogenism.

## 2020-03-09 ENCOUNTER — Other Ambulatory Visit: Payer: Self-pay | Admitting: Internal Medicine

## 2020-03-09 ENCOUNTER — Ambulatory Visit: Payer: Medicare Other | Admitting: Internal Medicine

## 2020-03-09 ENCOUNTER — Other Ambulatory Visit: Payer: Self-pay

## 2020-03-09 ENCOUNTER — Encounter: Payer: Self-pay | Admitting: Internal Medicine

## 2020-03-09 VITALS — BP 138/78 | HR 75 | Temp 97.1°F | Ht 64.0 in | Wt 223.6 lb

## 2020-03-09 DIAGNOSIS — I480 Paroxysmal atrial fibrillation: Secondary | ICD-10-CM

## 2020-03-09 DIAGNOSIS — Z7901 Long term (current) use of anticoagulants: Secondary | ICD-10-CM | POA: Diagnosis not present

## 2020-03-09 DIAGNOSIS — Z01810 Encounter for preprocedural cardiovascular examination: Secondary | ICD-10-CM | POA: Diagnosis not present

## 2020-03-09 DIAGNOSIS — I5189 Other ill-defined heart diseases: Secondary | ICD-10-CM | POA: Diagnosis not present

## 2020-03-09 MED ORDER — APIXABAN 5 MG PO TABS: 5.0000 mg | ORAL_TABLET | Freq: Two times a day (BID) | ORAL | 1 refills | Status: DC

## 2020-03-09 MED ORDER — APIXABAN 5 MG PO TABS: 5.0000 mg | ORAL_TABLET | Freq: Two times a day (BID) | ORAL | 3 refills | Status: DC

## 2020-03-09 NOTE — Patient Instructions (Signed)
Medication Instructions:  OK to stop aspirin Continue Eliquis  *If you need a refill on your cardiac medications before your next appointment, please call your pharmacy*   Lab Work: NONE If you have labs (blood work) drawn today and your tests are completely normal, you will receive your results only by: Marland Kitchen MyChart Message (if you have MyChart) OR . A paper copy in the mail If you have any lab test that is abnormal or we need to change your treatment, we will call you to review the results.   Testing/Procedures: Your physician has requested that you have an echocardiogram to be scheduled in 3 months - June 2021. Echocardiography is a painless test that uses sound waves to create images of your heart. It provides your doctor with information about the size and shape of your heart and how well your heart's chambers and valves are working. This procedure takes approximately one hour. There are no restrictions for this procedure. -- done @ [address] - [address]rd Floor   Follow-Up: At [facility], you and your health needs are our priority.  As part of our continuing mission to provide you with exceptional heart care, we have created designated Provider Care Teams.  These Care Teams include your primary Cardiologist (physician) and Advanced Practice Providers (APPs -  Physician Assistants and Nurse Practitioners) who all work together to provide you with the care you need, when you need it.  We recommend signing up for the patient portal called "MyChart".  Sign up information is provided on this After Visit Summary.  MyChart is used to connect with patients for Virtual Visits (Telemedicine).  Patients are able to view lab/test results, encounter notes, upcoming appointments, etc.  Non-urgent messages can be sent to your provider as well.   To learn more about what you can do with MyChart, go to [email or web address].    Your next appointment:   3 month(s)  The format for your next  appointment:   In Person  Provider:   You may see Dr. Rennis Golden or one of the following Advanced Practice Providers on your designated Care Team:    Azalee Course, PA-C  Micah Flesher, [place] or   Judy Pimple, [place]    Other Instructions

## 2020-03-09 NOTE — Progress Notes (Signed)
OFFICE CONSULT NOTE  Chief Complaint:  Right atrial mass  Primary Care Physician: Willow Ora, MD  HPI:  Alison Monroe is a 59 y.o. female who is being seen today for the evaluation of right atrial mass at the request of Willow Ora, MD. This is a pleasant 59 year old female with unfortunate longstanding issues related to a spine injury secondary to fall number of years ago.  She is received care both in [place], [place] where she primarily resides, the [place] clinic and also at [facility] most recently.  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  This required removal of the hardware and antibiotic therapy.  During that hospitalization she was found to have a 2 x 2 centimeter mass in the right atrium concerning for possible thrombus.  A cardiac MRI was performed which confirmed this and it was noted to be at the diaphragmatic portion of the right atrium adjacent to the IVC and in close proximity to the tricuspid valve.  It was not noted to extend in the SVC or IVC.  She had bilateral upper and lower extremity venous Dopplers in January at [facility] which were negative for thrombus and she has some paroxysmal atrial fibrillation noted during her hospitalization and she was started on Eliquis 5 mg twice daily.  The right atrial mass was noted in mid February.  Since then she is done fairly well except she does have a nonhealed wound in her back and will need a repeat surgery to restabilize the spine and close this wound.  Apparently this is depended on my recommendations for being able to come off anticoagulant therapy.  PMHx:  Past Medical History:  Diagnosis Date  . Arthritis   . Asthma   . Chronic kidney disease    Stage 3  . Diabetes (HCC)   . Heart murmur     Past Surgical History:  Procedure Laterality Date  . ANKLE SURGERY Left   . BREAST BIOPSY    . CATARACT  EXTRACTION  2015  . GALLBLADDER SURGERY  1981  . GASTRIC BYPASS    . LUMBAR SPINE SURGERY     Several. Rods, bolts and screws in place  . TOTAL KNEE ARTHROPLASTY Right 2015    FAMHx:  Family History  Problem Relation Age of Onset  . Arthritis Mother   . Asthma Mother   . COPD Mother   . Depression Mother   . Diabetes Mother   . Early death Mother   . Heart disease Mother   . Hyperlipidemia Mother   . Hypertension Mother   . Heart attack Mother   . COPD Father   . Diabetes Father   . Early death Father   . Heart attack Father   . Kidney disease Father   . Hypertension Father   . Stroke Father   . Diabetes Brother   . Heart disease Brother   . Hyperlipidemia Brother   . Hypertension Brother   . Arthritis Maternal Grandmother   . Asthma Maternal Grandmother   . Diabetes Maternal Grandmother   . Cancer Maternal Grandfather     SOCHx:   reports that she has never smoked. She has never used smokeless tobacco. She reports that she does not drink alcohol or use drugs.  ALLERGIES:  Allergies  Allergen Reactions  . Dextran Other (See Comments)  . Erythromycin Other (See Comments)    GI  upset  . Erythromycin Base Other (See Comments)  . Metronidazole     Other reaction(s): Abdominal Pain  . Other Swelling    dexon sutures   . Penicillins   . Vancomycin Other (See Comments)    Kidney failure  . Adhesive  [Tape] Rash    Paper tape  . Ampicillin Rash    ROS: Pertinent items noted in HPI and remainder of comprehensive ROS otherwise negative.  HOME MEDS: Current Outpatient Medications on File Prior to Visit  Medication Sig Dispense Refill  . acetaminophen (TYLENOL) 325 MG tablet Take 2 tablets by mouth 2 (two) times daily as needed.    . baclofen (LIORESAL) 20 MG tablet     . Cholecalciferol 1.25 MG (50000 UT) capsule Take 1 capsule by mouth 2 (two) times daily.     . clindamycin (CLEOCIN) 150 MG capsule Take 150 mg by mouth 4 (four) times daily.    .  cyanocobalamin (,VITAMIN B-12,) 1000 MCG/ML injection Inject 1 mL into the skin.    Marland Kitchen diclofenac sodium (VOLTAREN) 1 % GEL Apply topically.    . diphenhydrAMINE (BENADRYL ALLERGY) 25 mg capsule     . escitalopram (LEXAPRO) 10 MG tablet Take 10 mg by mouth daily.    . fentaNYL (DURAGESIC - DOSED MCG/HR) 100 MCG/HR Place 1 patch onto the skin every other day.    Marland Kitchen HYDROmorphone (DILAUDID) 8 MG tablet Take 1 tablet by mouth every 12 (twelve) hours. Takes 8mg  every 6 hours and prn    . Multiple Vitamin (MULTIVITAMIN) tablet Take 1 tablet by mouth daily.    omeprazole (PRILOSEC) 20 MG capsule Take 1 capsule by mouth 2 (two) times daily.    Marland Kitchen omeprazole (PRILOSEC) 20 MG capsule TAKE 1 CAPSULE BY MOUTH  ONCE DAILY 30 MINUTES  BEFORE MORNING MEAL    . potassium chloride (KLOR-CON) 8 MEQ tablet Take 8 mEq by mouth daily.    Marland Kitchen sulfamethoxazole-trimethoprim (BACTRIM DS,SEPTRA DS) 800-160 MG tablet     . topiramate (TOPAMAX) 50 MG tablet Take 50 mg by mouth 3 (three) times daily.    Marland Kitchen torsemide (DEMADEX) 20 MG tablet Take 10 mg by mouth daily.   0   No current facility-administered medications on file prior to visit.    LABS/IMAGING: No results found for this or any previous visit (from the past 48 hour(s)). No results found.  LIPID PANEL:    Component Value Date/Time   CHOL 182 01/12/2019 1102   TRIG 64.0 01/12/2019 1102   HDL 56.10 01/12/2019 1102   CHOLHDL 3 01/12/2019 1102   VLDL 12.8 01/12/2019 1102   LDLCALC 113 (H) 01/12/2019 1102    WEIGHTS: Wt Readings from Last 3 Encounters:  03/09/20 223 lb 9.6 oz (101.4 kg)  02/21/20 235 lb 3.2 oz (106.7 kg)  12/21/19 221 lb (100.2 kg)    VITALS: BP 138/78   Pulse 75   Temp (!) 97.1 F (36.2 C)   Ht 5\' 4"  (1.626 m)   Wt 223 lb 9.6 oz (101.4 kg)   SpO2 100%   BMI 38.38 kg/m   EXAM: General appearance: alert, no distress, moderately obese and in a torso/back brace Neck: no carotid bruit, no JVD and thyroid not enlarged, symmetric,  no tenderness/mass/nodules Lungs: clear to auscultation bilaterally Heart: regular rate and rhythm, S1, S2 normal and systolic murmur: early systolic 2/6, blowing at apex Abdomen: soft, non-tender; bowel sounds normal; no masses,  no organomegaly and obese Extremities: extremities normal, atraumatic, no cyanosis or  edema Pulses: 2+ and symmetric Skin: Pale, warm, dry Neurologic: Mental status: Alert, oriented, thought content appropriate Psych: Pleasant  EKG: Normal sinus rhythm 69- personally reviewed  ASSESSMENT: 1. 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 Negative upper and LE dopplers in 12/2019 3. PAF - CHADSVASC score 2  PLAN: 1.   Ms. Stinger has a right atrial thrombus measuring about 2 x 2 cm.  This did not appear to be obstructive or interfering with the vena cava.  She was appropriately placed on Eliquis and it is also indicated for PAF.  She appears to be in sinus rhythm today.  This could have been related to her infection.  She says she has a history of palpitations however in the past.  She reports a prior upper extremity DVT that may have been related to a line in the past as well.  I suspect the PICC line could have been a nidus for her thrombus.  I will review current guidelines however would recommend a minimum of 3 months of anticoagulation and then we will plan a repeat echocardiogram to see if this is resolved.  If at that point it has resolved, we could consider holding the Eliquis for 3 days prior to a planned surgery and then proceeding, with the plan to restart it as soon as possible afterwards and likely continue it indefinitely.  She is agreeable to this plan.  Follow-up with me in 3 months.  Thanks again for the kind referral.  Pixie Casino, MD, FACC, [place] Director of the [facility]  [facility] Diplomate of the American  Board of Clinical Lipidology Attending Cardiologist  Direct Dial: [phone]  Fax: [phone]  Website:  [email or web address]  Nadean Corwin Silverio Hagan 03/09/2020, 1:05 PM

## 2020-03-10 ENCOUNTER — Telehealth: Payer: Self-pay | Admitting: Internal Medicine

## 2020-03-10 NOTE — Telephone Encounter (Signed)
Pt has Echo 05/22/20... I made her an appt for her 3 month OV 05/25/20 with Dr. Rennis Golden.

## 2020-03-10 NOTE — Telephone Encounter (Signed)
Patient calling to find out if she needs to have her echo 3 months from her last office visit 03/09/2020 or 3 months from her last echo in February. Please advise.

## 2020-03-13 ENCOUNTER — Telehealth: Payer: Self-pay

## 2020-03-13 NOTE — Telephone Encounter (Signed)
Physical Therapist calling to provide information. Pain level for patient is  7 out of 10.  Rep did make adjust the brace for patient and that  Improved he patient pain.

## 2020-03-14 ENCOUNTER — Telehealth: Payer: Self-pay | Admitting: *Deleted

## 2020-03-14 ENCOUNTER — Ambulatory Visit
Admission: RE | Admit: 2020-03-14 | Discharge: 2020-03-14 | Disposition: A | Discharge status: Home / Self Care | Payer: Medicare Other | Source: Ambulatory Visit | Attending: Family Medicine | Admitting: Family Medicine

## 2020-03-14 ENCOUNTER — Other Ambulatory Visit: Payer: Self-pay

## 2020-03-14 DIAGNOSIS — E2839 Other primary ovarian failure: Secondary | ICD-10-CM

## 2020-03-14 NOTE — Telephone Encounter (Signed)
FYI  Nurse Alcario Drought From home health, want to informed you  Wound was clean with saline and use dressing as directed. Supplies order will be at pt house at the end of the week

## 2020-03-16 ENCOUNTER — Telehealth: Payer: Self-pay | Admitting: Family Medicine

## 2020-03-16 ENCOUNTER — Other Ambulatory Visit: Payer: Self-pay

## 2020-03-16 NOTE — Telephone Encounter (Signed)
Please advise 

## 2020-03-16 NOTE — Telephone Encounter (Signed)
  LAST APPOINTMENT DATE: 03/14/2020   NEXT APPOINTMENT DATE:@6 /08/2020  MEDICATION: DOXYCYDLINE 100 MG / SENOKOT    PHARMACY:[facility] [address] [place] - [address]  COMMENT: Would like a 3 month supply, is completely out./ Asked if Dr.Andy would be able to send in Senokot to pharmacy so she is able to have insurance pay./ Also asked if she could get a written prescription for a upright rollator walker.   **Let patient know to contact pharmacy at the end of the day to make sure medication is ready. **  ** Please notify patient to allow 48-72 hours to process**  **Encourage patient to contact the pharmacy for refills or they can request refills through [facility]**  CLINICAL FILLS OUT ALL BELOW:   LAST REFILL:  QTY:  REFILL DATE:    OTHER COMMENTS:    Okay for refill?  Please advise    .

## 2020-03-20 ENCOUNTER — Other Ambulatory Visit: Payer: Self-pay

## 2020-03-20 DIAGNOSIS — G894 Chronic pain syndrome: Secondary | ICD-10-CM

## 2020-03-20 MED ORDER — SENNA 8.6 MG PO TABS: 1.0000 | ORAL_TABLET | Freq: Every day | ORAL | 0 refills | Status: DC

## 2020-03-20 NOTE — Telephone Encounter (Signed)
Senekot sent to [facility]. Rollator walker script sent to [facility] in [place].

## 2020-03-20 NOTE — Telephone Encounter (Signed)
May send in rx for senekot and upright rollator walker.

## 2020-03-23 DIAGNOSIS — I87313 Chronic venous hypertension (idiopathic) with ulcer of bilateral lower extremity: Secondary | ICD-10-CM | POA: Diagnosis not present

## 2020-03-23 DIAGNOSIS — M4636 Infection of intervertebral disc (pyogenic), lumbar region: Secondary | ICD-10-CM | POA: Diagnosis not present

## 2020-03-24 ENCOUNTER — Other Ambulatory Visit: Payer: Self-pay

## 2020-03-24 ENCOUNTER — Telehealth: Payer: Self-pay | Admitting: Family Medicine

## 2020-03-24 MED ORDER — TORSEMIDE 20 MG PO TABS: 10.0000 mg | ORAL_TABLET | Freq: Every day | ORAL | 3 refills | Status: DC

## 2020-03-24 NOTE — Telephone Encounter (Signed)
Pt called stating her neurologist saw her done density scans and told her she needs to be referred to an endocrinologist. Pt states she is not able to get surgery until her bone density improved. Pt is requesting a referral to endocrinology. Please advise.

## 2020-03-24 NOTE — Telephone Encounter (Signed)
MEDICATION: torsemide (Demadex) 20 MG tablet  PHARMACY: [facility] [address]  Comments:   **Let patient know to contact pharmacy at the end of the day to make sure medication is ready. **  ** Please notify patient to allow 48-72 hours to process**  **Encourage patient to contact the pharmacy for refills or they can request refills through [facility]**

## 2020-03-24 NOTE — Telephone Encounter (Signed)
Medication sent into pharmacy  

## 2020-03-27 ENCOUNTER — Other Ambulatory Visit: Payer: Self-pay

## 2020-03-27 DIAGNOSIS — Q782 Osteopetrosis: Secondary | ICD-10-CM

## 2020-03-27 DIAGNOSIS — M810 Age-related osteoporosis without current pathological fracture: Secondary | ICD-10-CM

## 2020-03-27 NOTE — Telephone Encounter (Signed)
Please refer to endocrine for severe osteoporosis management. Thanks. Notify pt

## 2020-03-27 NOTE — Telephone Encounter (Signed)
Per DPR, left detailed message of VM informing patient that I sent in Endo referral

## 2020-03-28 ENCOUNTER — Telehealth: Payer: Self-pay | Admitting: Family Medicine

## 2020-03-28 NOTE — Telephone Encounter (Signed)
Patient is calling in stating her nephew is now covid positive,and is currently living with him. Would like advice on what to do, also states she has an appointment for the second covid vaccine on the 15th.

## 2020-03-28 NOTE — Telephone Encounter (Signed)
Per DPR, left detailed message on VM advising patient to try to quarantine away from her nephew. That she will need to reschedule her COVID vaccine, and that if she begins to have any symptoms, she needs to be tested.

## 2020-03-28 NOTE — Telephone Encounter (Signed)
Patient is returning Katie's call and states she has a few questions about having to reschedule the covid vaccine, and would like to know if she can go out to her other doctors appointments.

## 2020-03-28 NOTE — Telephone Encounter (Signed)
Patient is aware that she has to quarantine. Is it OK for her to be past the 28 days for her second vaccine?

## 2020-03-29 NOTE — Telephone Encounter (Signed)
MyChart message sent to patient per patient request.

## 2020-03-29 NOTE — Telephone Encounter (Signed)
Should not (and likely won't be allowed) to go to doctor's office since exposed to covid.  Quarantine for 14 days.   If remains negative, can get 2nd covid vaccine at the end of the quarantine.

## 2020-03-31 ENCOUNTER — Ambulatory Visit (INDEPENDENT_AMBULATORY_CARE_PROVIDER_SITE_OTHER): Payer: Medicare Other

## 2020-03-31 ENCOUNTER — Other Ambulatory Visit: Payer: Self-pay

## 2020-03-31 DIAGNOSIS — Z0000 Encounter for general adult medical examination without abnormal findings: Secondary | ICD-10-CM | POA: Diagnosis not present

## 2020-03-31 NOTE — Patient Instructions (Signed)
Alison Monroe , Thank you for taking time to come for your Medicare Wellness Visit. I appreciate your ongoing commitment to your health goals. Please review the following plan we discussed and let me know if I can assist you in the future.   Screening recommendations/referrals: Colorectal Screening: up to date; last colonoscopy 12/18/15 Mammogram: last 05/2015; deferring due to back pain Bone Density: up to date; last 03/14/20  Vision and Dental Exams: Recommended annual ophthalmology exams for early detection of glaucoma and other disorders of the eye Recommended annual dental exams for proper oral hygiene  Vaccinations: Influenza vaccine: completed 08/30/19 Pneumococcal vaccine: up to date; last 10/13/17 Tdap vaccine: recommended every 10 years; Please call your insurance company to determine your out of pocket expense. You also receive this vaccine at your local pharmacy or Health Dept. Shingles vaccine:  You may receive this vaccine at your local pharmacy. (see handout)   Advanced directives: Please bring a copy of your POA (Power of Attorney) and/or Living Will to your next appointment.  Goals: Recommend to drink at least 6-8 8oz glasses of water per day and consume a balanced diet rich in fresh fruits and vegetables.   I will be in touch with information on available resources to help with your situation.    Next appointment: Please schedule your Annual Wellness Visit with your Nurse Health Advisor in one year.  Preventive Care 40-64 Years, Female Preventive care refers to lifestyle choices and visits with your health care provider that can promote health and wellness. What does preventive care include?  A yearly physical exam. This is also called an annual well check.  Dental exams once or twice a year.  Routine eye exams. Ask your health care provider how often you should have your eyes checked.  Personal lifestyle choices, including:  Daily care of your teeth and  gums.  Regular physical activity.  Eating a healthy diet.  Avoiding tobacco and drug use.  Limiting alcohol use.  Practicing safe sex.  Taking low-dose aspirin daily starting at age 71 if recommended by your health care provider.  Taking vitamin and mineral supplements as recommended by your health care provider. What happens during an annual well check? The services and screenings done by your health care provider during your annual well check will depend on your age, overall health, lifestyle risk factors, and family history of disease. Counseling  Your health care provider may ask you questions about your:  Alcohol use.  Tobacco use.  Drug use.  Emotional well-being.  Home and relationship well-being.  Sexual activity.  Eating habits.  Work and work Statistician.  Method of birth control.  Menstrual cycle.  Pregnancy history. Screening  You may have the following tests or measurements:  Height, weight, and BMI.  Blood pressure.  Lipid and cholesterol levels. These may be checked every 5 years, or more frequently if you are over 20 years old.  Skin check.  Lung cancer screening. You may have this screening every year starting at age 36 if you have a 30-pack-year history of smoking and currently smoke or have quit within the past 15 years.  Fecal occult blood test (FOBT) of the stool. You may have this test every year starting at age 63.  Flexible sigmoidoscopy or colonoscopy. You may have a sigmoidoscopy every 5 years or a colonoscopy every 10 years starting at age 31.  Hepatitis C blood test.  Hepatitis B blood test.  Sexually transmitted disease (STD) testing.  Diabetes screening. This is  done by checking your blood sugar (glucose) after you have not eaten for a while (fasting). You may have this done every 1-3 years.  Mammogram. This may be done every 1-2 years. Talk to your health care provider about when you should start having regular  mammograms. This may depend on whether you have a family history of breast cancer.  BRCA-related cancer screening. This may be done if you have a family history of breast, ovarian, tubal, or peritoneal cancers.  Pelvic exam and Pap test. This may be done every 3 years starting at age 74. Starting at age 96, this may be done every 5 years if you have a Pap test in combination with an HPV test.  Bone density scan. This is done to screen for osteoporosis. You may have this scan if you are at high risk for osteoporosis. Discuss your test results, treatment options, and if necessary, the need for more tests with your health care provider. Vaccines  Your health care provider may recommend certain vaccines, such as:  Influenza vaccine. This is recommended every year.  Tetanus, diphtheria, and acellular pertussis (Tdap, Td) vaccine. You may need a Td booster every 10 years.  Zoster vaccine. You may need this after age 72.  Pneumococcal 13-valent conjugate (PCV13) vaccine. You may need this if you have certain conditions and were not previously vaccinated.  Pneumococcal polysaccharide (PPSV23) vaccine. You may need one or two doses if you smoke cigarettes or if you have certain conditions. Talk to your health care provider about which screenings and vaccines you need and how often you need them. This information is not intended to replace advice given to you by your health care provider. Make sure you discuss any questions you have with your health care provider. Document Released: 12/29/2015 Document Revised: 08/21/2016 Document Reviewed: 10/03/2015 Elsevier Interactive Patient Education  2017 [place] Prevention in the Home Falls can cause injuries. They can happen to people of all ages. There are many things you can do to make your home safe and to help prevent falls. What can I do on the outside of my home?  Regularly fix the edges of walkways and driveways and fix any  cracks.  Remove anything that might make you trip as you walk through a door, such as a raised step or threshold.  Trim any bushes or trees on the path to your home.  Use bright outdoor lighting.  Clear any walking paths of anything that might make someone trip, such as rocks or tools.  Regularly check to see if handrails are loose or broken. Make sure that both sides of any steps have handrails.  Any raised decks and porches should have guardrails on the edges.  Have any leaves, snow, or ice cleared regularly.  Use sand or salt on walking paths during winter.  Clean up any spills in your garage right away. This includes oil or grease spills. What can I do in the bathroom?  Use night lights.  Install grab bars by the toilet and in the tub and shower. Do not use towel bars as grab bars.  Use non-skid mats or decals in the tub or shower.  If you need to sit down in the shower, use a plastic, non-slip stool.  Keep the floor dry. Clean up any water that spills on the floor as soon as it happens.  Remove soap buildup in the tub or shower regularly.  Attach bath mats securely with double-sided non-slip rug  tape.  Do not have throw rugs and other things on the floor that can make you trip. What can I do in the bedroom?  Use night lights.  Make sure that you have a light by your bed that is easy to reach.  Do not use any sheets or blankets that are too big for your bed. They should not hang down onto the floor.  Have a firm chair that has side arms. You can use this for support while you get dressed.  Do not have throw rugs and other things on the floor that can make you trip. What can I do in the kitchen?  Clean up any spills right away.  Avoid walking on wet floors.  Keep items that you use a lot in easy-to-reach places.  If you need to reach something above you, use a strong step stool that has a grab bar.  Keep electrical cords out of the way.  Do not use floor  polish or wax that makes floors slippery. If you must use wax, use non-skid floor wax.  Do not have throw rugs and other things on the floor that can make you trip. What can I do with my stairs?  Do not leave any items on the stairs.  Make sure that there are handrails on both sides of the stairs and use them. Fix handrails that are broken or loose. Make sure that handrails are as long as the stairways.  Check any carpeting to make sure that it is firmly attached to the stairs. Fix any carpet that is loose or worn.  Avoid having throw rugs at the top or bottom of the stairs. If you do have throw rugs, attach them to the floor with carpet tape.  Make sure that you have a light switch at the top of the stairs and the bottom of the stairs. If you do not have them, ask someone to add them for you. What else can I do to help prevent falls?  Wear shoes that:  Do not have high heels.  Have rubber bottoms.  Are comfortable and fit you well.  Are closed at the toe. Do not wear sandals.  If you use a stepladder:  Make sure that it is fully opened. Do not climb a closed stepladder.  Make sure that both sides of the stepladder are locked into place.  Ask someone to hold it for you, if possible.  Clearly mark and make sure that you can see:  Any grab bars or handrails.  First and last steps.  Where the edge of each step is.  Use tools that help you move around (mobility aids) if they are needed. These include:  Canes.  Walkers.  Scooters.  Crutches.  Turn on the lights when you go into a dark area. Replace any light bulbs as soon as they burn out.  Set up your furniture so you have a clear path. Avoid moving your furniture around.  If any of your floors are uneven, fix them.  If there are any pets around you, be aware of where they are.  Review your medicines with your doctor. Some medicines can make you feel dizzy. This can increase your chance of falling. Ask your  doctor what other things that you can do to help prevent falls. This information is not intended to replace advice given to you by your health care provider. Make sure you discuss any questions you have with your health care provider. Document Released: 09/28/2009  Document Revised: 05/09/2016 Document Reviewed: 01/06/2015 Elsevier Interactive Patient Education  2017 [employer].

## 2020-03-31 NOTE — Progress Notes (Signed)
This visit is being conducted via phone call due to the COVID-19 pandemic. This patient has given me verbal consent via phone to conduct this visit, patient states they are participating from their home address. Some vital signs may be absent or patient reported.   Patient identification: identified by name, DOB, and current address.  Location provider: [facility], Office Persons participating in the virtual visit: Kandis Fantasia LPN, patient, and Dr. Asencion Partridge   Subjective:   Alison Monroe is a 59 y.o. female who presents for an Initial Medicare Annual Wellness Visit.  Review of Systems     Cardiac Risk Factors include: sedentary lifestyle    Objective:    There were no vitals filed for this visit. There is no height or weight on file to calculate BMI.  Advanced Directives 03/31/2020  Does Patient Have a Medical Advance Directive? Yes  Type of Advance Directive Living will  Does patient want to make changes to medical advance directive? No - Patient declined    Current Medications (verified) Outpatient Encounter Medications as of 03/31/2020  Medication Sig  . camphor-menthol (SARNA) lotion Apply topically.  Marland Kitchen doxycycline (MONODOX) 100 MG capsule Take by mouth.  . ferrous sulfate 325 (65 FE) MG tablet Take by mouth.  Marland Kitchen acetaminophen (TYLENOL) 325 MG tablet Take 2 tablets by mouth 2 (two) times daily as needed.  Marland Kitchen apixaban (ELIQUIS) 5 MG TABS tablet Take 1 tablet (5 mg total) by mouth 2 (two) times daily.  Marland Kitchen apixaban (ELIQUIS) 5 MG TABS tablet Take 1 tablet (5 mg total) by mouth 2 (two) times daily.  . baclofen (LIORESAL) 20 MG tablet   . Cholecalciferol 1.25 MG (50000 UT) capsule Take 1 capsule by mouth 2 (two) times daily.   . clindamycin (CLEOCIN) 150 MG capsule Take 150 mg by mouth 4 (four) times daily.  . cyanocobalamin (,VITAMIN B-12,) 1000 MCG/ML injection Inject 1 mL into the skin.  Marland Kitchen diclofenac sodium (VOLTAREN) 1 % GEL Apply topically.  . diclofenac Sodium  (VOLTAREN) 1 % GEL APPLY TOPICALLY 2 GRAMS TO  AFFECTED JOINT TWICE DAILY  . diphenhydrAMINE (BENADRYL ALLERGY) 25 mg capsule   . escitalopram (LEXAPRO) 10 MG tablet Take 10 mg by mouth daily.  . fentaNYL (DURAGESIC - DOSED MCG/HR) 100 MCG/HR Place 1 patch onto the skin every other day.  Marland Kitchen HYDROmorphone (DILAUDID) 8 MG tablet Take 1 tablet by mouth every 12 (twelve) hours. Takes 8mg  every 6 hours and prn  . Multiple Vitamin (MULTIVITAMIN) tablet Take 1 tablet by mouth daily.  omeprazole (PRILOSEC) 20 MG capsule Take 1 capsule by mouth 2 (two) times daily.  Marland Kitchen omeprazole (PRILOSEC) 20 MG capsule TAKE 1 CAPSULE BY MOUTH  ONCE DAILY 30 MINUTES  BEFORE MORNING MEAL  . potassium chloride (KLOR-CON) 8 MEQ tablet Take 8 mEq by mouth daily.  Marland Kitchen senna (SENOKOT) 8.6 MG TABS tablet Take 1 tablet (8.6 mg total) by mouth daily.  Marland Kitchen sulfamethoxazole-trimethoprim (BACTRIM DS,SEPTRA DS) 800-160 MG tablet   . topiramate (TOPAMAX) 50 MG tablet Take 50 mg by mouth 3 (three) times daily.  Marland Kitchen torsemide (DEMADEX) 20 MG tablet Take 0.5 tablets (10 mg total) by mouth daily.   No facility-administered encounter medications on file as of 03/31/2020.    Allergies (verified) Dextran, Erythromycin, Erythromycin base, Metronidazole, Other, Penicillins, Vancomycin, Adhesive  [tape], and Ampicillin   History: Past Medical History:  Diagnosis Date  . Arthritis   . Asthma   . Chronic kidney disease    Stage  3  . Diabetes ([place])   . Heart murmur    Past Surgical History:  Procedure Laterality Date  . ANKLE SURGERY Left   . BREAST BIOPSY    . CATARACT EXTRACTION  2015  . [place]  . GASTRIC BYPASS    . LUMBAR SPINE SURGERY     Several. Rods, bolts and screws in place  . TOTAL KNEE ARTHROPLASTY Right 2015   Family History  Problem Relation Age of Onset  . Arthritis Mother   . Asthma Mother   . COPD Mother   . Depression Mother   . Diabetes Mother   . Early death Mother   . Heart disease  Mother   . Hyperlipidemia Mother   . Hypertension Mother   . Heart attack Mother   . COPD Father   . Diabetes Father   . Early death Father   . Heart attack Father   . Kidney disease Father   . Hypertension Father   . Stroke Father   . Diabetes Brother   . Heart disease Brother   . Hyperlipidemia Brother   . Hypertension Brother   . Arthritis Maternal Grandmother   . Asthma Maternal Grandmother   . Diabetes Maternal Grandmother   . Cancer Maternal Grandfather    Social History   Socioeconomic History  . Marital status: Single    Spouse name: Not on file  . Number of children: 0  . Years of education: Not on file  . Highest education level: Not on file  Occupational History  . Occupation: disabled  Tobacco Use  . Smoking status: Never Smoker  . Smokeless tobacco: Never Used  Substance and Sexual Activity  . Alcohol use: Never  . Drug use: Never  . Sexual activity: Not on file  Other Topics Concern  . Not on file  Social History Narrative   Living with her brother and his family (strained relationship)    Previously living in [place] prior to last back surgery, plans to return to [place] once she is able to care for herself better    Social Determinants of Health   Financial Resource Strain:   . Difficulty of Paying Living Expenses:   Food Insecurity:   . Worried About Charity fundraiser in the Last Year:   . Arboriculturist in the Last Year:   Transportation Needs:   . Film/video editor (Medical):   Marland Kitchen Lack of Transportation (Non-Medical):   Physical Activity:   . Days of Exercise per Week:   . Minutes of Exercise per Session:   Stress:   . Feeling of Stress :   Social Connections:   . Frequency of Communication with Friends and Family:   . Frequency of Social Gatherings with Friends and Family:   . Attends Religious Services:   . Active Member of Clubs or Organizations:   . Attends Archivist Meetings:   Marland Kitchen Marital Status:      Tobacco Counseling Counseling given: Not Answered   Clinical Intake:  Pre-visit preparation completed: Yes  Pain : Faces Faces Pain Scale: Hurts whole lot Pain Type: Chronic pain Pain Location: Back Pain Frequency: Constant  Faces Pain Scale: Hurts whole lot  Diabetes: No  How often do you need to have someone help you when you read instructions, pamphlets, or other written materials from your doctor or pharmacy?: 1 - Never  Interpreter Needed?: No  Information entered by :: Denman George LPN   Activities  of Daily Living In your present state of health, do you have any difficulty performing the following activities: 03/31/2020 12/06/2019  Hearing? N N  Vision? N N  Difficulty concentrating or making decisions? N N  Walking or climbing stairs? Y Y  Dressing or bathing? Y Y  Doing errands, shopping? Malvin Johns  Preparing Food and eating ? N -  Using the Toilet? N -  In the past six months, have you accidently leaked urine? N -  Do you have problems with loss of bowel control? N -  Managing your Medications? N -  Managing your Finances? N -  Housekeeping or managing your Housekeeping? Y -  Some recent data might be hidden     Immunizations and Health Maintenance Immunization History  Administered Date(s) Administered  . Influenza, Quadrivalent, Recombinant, Inj, Pf 08/20/2017  . Influenza-Unspecified 09/10/2016, 08/30/2019  . Pneumococcal Conjugate-13 09/17/2017  . Pneumococcal Polysaccharide-23 10/13/2018   Health Maintenance Due  Topic Date Due  . Hepatitis C Screening  Never done  . HIV Screening  Never done  . PAP SMEAR-Modifier  Never done    Patient Care Team: Willow Ora, MD as PCP - General (Family Medicine) Maxwell Caul, MD as Consulting Physician (Internal Medicine) Margarita Rana, MD (Neurosurgery) Rennis Golden Lisette Abu, MD as Consulting Physician (Cardiology)  Indicate any recent Medical Services you may have received from other than [facility]  providers in the past year (date may be approximate).     Assessment:   This is a routine wellness examination for Alison Monroe.  Hearing/Vision screen No exam data present  Dietary issues and exercise activities discussed: Current Exercise Habits: The patient does not participate in regular exercise at present  Goals   None    Depression Screen [facility] 2/9 Scores 03/31/2020 05/28/2019 12/17/2018  PHQ - 2 Score - 0 0  Exception Documentation Other- indicate reason in comment box - -    Fall Risk Fall Risk  03/31/2020 05/28/2019 12/17/2018  Falls in the past year? 1 1 1   Number falls in past yr: 1 0 1  Injury with Fall? 0 0 0  Risk for fall due to : Impaired mobility;History of fall(s);Medication side effect Impaired balance/gait History of fall(s);Impaired balance/gait  Follow up Falls evaluation completed;Education provided;Falls prevention discussed Falls evaluation completed Falls evaluation completed    Is the patient's home free of loose throw rugs in walkways, pet beds, electrical cords, etc?   yes      Grab bars in the bathroom? yes      Handrails on the stairs?   yes      Adequate lighting?   yes    Cognitive Function: no cognitive concerns at this time     6CIT Screen 03/31/2020  What Year? 0 points  What month? 0 points  What time? 0 points  Count back from 20 0 points  Months in reverse 0 points  Repeat phrase 0 points  Total Score 0    Screening Tests Health Maintenance  Topic Date Due  . Hepatitis C Screening  Never done  . HIV Screening  Never done  . PAP SMEAR-Modifier  Never done  . MAMMOGRAM  12/05/2020 (Originally 05/17/2019)  . INFLUENZA VACCINE  07/16/2020  . TETANUS/TDAP  12/17/2024  . COLONOSCOPY  12/17/2025    Qualifies for Shingles Vaccine? Discussed and patient will check with pharmacy for coverage.  Patient education handout provided   Cancer Screenings: Lung: Low Dose CT Chest recommended if Age 47-80 years, 30 pack-year  currently smoking OR have  quit w/in 15years. Patient does not qualify. Breast: Up to date on Mammogram? Yes, would like to hold on scheduling for 2021 Up to date of Bone Density/Dexa? Yes Colorectal: colonoscopy 12/18/15     Plan:  I have personally reviewed and addressed the Medicare Annual Wellness questionnaire and have noted the following in the patient's chart:  A. Medical and social history B. Use of alcohol, tobacco or illicit drugs  C. Current medications and supplements D. Functional ability and status E.  Nutritional status F.  Physical activity G. Advance directives H. List of other physicians I.  Hospitalizations, surgeries, and ER visits in previous 12 months J.  Vitals K. Screenings such as hearing and vision if needed, cognitive and depression L. Referrals, records requested, and appointments- none   In addition, I have reviewed and discussed with patient certain preventive protocols, quality metrics, and best practice recommendations. A written personalized care plan for preventive services as well as general preventive health recommendations were provided to patient.   Signed,  Kandis Fantasia, LPN  Nurse Health Advisor   Nurse Notes:  Will see what resources are available for in home assistance and transportation.  Also will provide patient with more information on Medicare covered services.

## 2020-04-07 ENCOUNTER — Telehealth: Payer: Self-pay | Admitting: Family Medicine

## 2020-04-07 NOTE — Telephone Encounter (Signed)
LMOVM advising OK for PT

## 2020-04-07 NOTE — Telephone Encounter (Signed)
Pete from [facility] is needing verbal orders to do PT for once a week for 2 weeks.They had to delay discharge due to patient having COVID.

## 2020-04-08 NOTE — Telephone Encounter (Signed)
Please give requested verbal orders. Thanks

## 2020-04-13 ENCOUNTER — Telehealth: Payer: Self-pay | Admitting: Family Medicine

## 2020-04-13 ENCOUNTER — Other Ambulatory Visit: Payer: Self-pay

## 2020-04-13 NOTE — Telephone Encounter (Signed)
Patient is calling in asking for a referral to a pain management doctor, called around and has found 3 doctors that interest her.   Charmayne Sheer- PH:[phone] - FAX: [phone]  Ellwood Sayers- [phone] - FAX: [phone] -> attention to [facility] [place]- PH:[phone] - [phone]

## 2020-04-14 NOTE — Telephone Encounter (Signed)
OK for referral?  

## 2020-04-17 ENCOUNTER — Encounter: Payer: Self-pay | Admitting: Endocrinology

## 2020-04-17 ENCOUNTER — Ambulatory Visit (INDEPENDENT_AMBULATORY_CARE_PROVIDER_SITE_OTHER): Payer: Medicare Other | Admitting: Endocrinology

## 2020-04-17 ENCOUNTER — Other Ambulatory Visit: Payer: Self-pay

## 2020-04-17 DIAGNOSIS — G894 Chronic pain syndrome: Secondary | ICD-10-CM

## 2020-04-17 DIAGNOSIS — Q782 Osteopetrosis: Secondary | ICD-10-CM

## 2020-04-17 DIAGNOSIS — M810 Age-related osteoporosis without current pathological fracture: Secondary | ICD-10-CM

## 2020-04-17 DIAGNOSIS — M5441 Lumbago with sciatica, right side: Secondary | ICD-10-CM

## 2020-04-17 DIAGNOSIS — G8929 Other chronic pain: Secondary | ICD-10-CM

## 2020-04-17 LAB — VITAMIN D 25 HYDROXY (VIT D DEFICIENCY, FRACTURES): VITD: 34.31 ng/mL (ref 30.00–100.00)

## 2020-04-17 LAB — TSH: TSH: 2.66 u[IU]/mL (ref 0.35–4.50)

## 2020-04-17 NOTE — Patient Instructions (Addendum)
Blood tests are requested for you today.  We'll let you know about the results.  Based on the results, I'll prescribe for you a pill and (every 6 months) injection. Please come back for a follow-up appointment in 6 months.

## 2020-04-17 NOTE — Telephone Encounter (Signed)
I thought she had one already? Ok to refer.
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
## 2020-04-17 NOTE — Telephone Encounter (Signed)
I did not see an active referral for pain management in patient's chart. I have ordered new one and included the 3 providers that patient is open to seeing.

## 2020-04-19 ENCOUNTER — Telehealth: Payer: Self-pay | Admitting: Family Medicine

## 2020-04-19 NOTE — Telephone Encounter (Signed)
LMOVM asking Alison Monroe [phone] to return my call

## 2020-04-19 NOTE — Telephone Encounter (Signed)
Nurse calling stating that Dr Cornell Barman  Has reviewed the records for the patient, and would like to know why this patient was referred to him. Nurse states her medications she is own they are not authorized to prescribe. Please Advise. jk

## 2020-04-20 ENCOUNTER — Telehealth: Payer: Self-pay | Admitting: Endocrinology

## 2020-04-20 NOTE — Telephone Encounter (Signed)
I need to start both Reclast and Prolia.  For PA, the reason she needs to start both right away is that she has infection of spinal bones, and urgently needs improved BMD in order to have procedures. Thank you.

## 2020-04-21 ENCOUNTER — Encounter: Payer: Self-pay | Admitting: Endocrinology

## 2020-04-21 LAB — PROTEIN ELECTROPHORESIS, SERUM
Albumin ELP: 3 g/dL — ABNORMAL LOW (ref 3.8–4.8)
Alpha 1: 0.4 g/dL — ABNORMAL HIGH (ref 0.2–0.3)
Alpha 2: 0.8 g/dL (ref 0.5–0.9)
Beta 2: 0.5 g/dL (ref 0.2–0.5)
Beta Globulin: 0.4 g/dL (ref 0.4–0.6)
Gamma Globulin: 1.2 g/dL (ref 0.8–1.7)
Total Protein: 6.2 g/dL (ref 6.1–8.1)

## 2020-04-21 LAB — VITAMIN D 1,25 DIHYDROXY
Vitamin D 1, 25 (OH)2 Total: 64 pg/mL (ref 18–72)
Vitamin D2 1, 25 (OH)2: <8 pg/mL
Vitamin D3 1, 25 (OH)2: 64 pg/mL

## 2020-04-21 LAB — PTH, INTACT AND CALCIUM
Calcium: 8.6 mg/dL (ref 8.6–10.4)
PTH: 48 pg/mL (ref 14–64)

## 2020-04-21 NOTE — Telephone Encounter (Signed)
Please refer to Dr. George Hugh message below

## 2020-04-24 NOTE — Telephone Encounter (Signed)
I am not sure what appointment she is referring to but if this is for Prolia and Reclast, I believe this is now being handled by Anette Riedel. Please review and advise

## 2020-04-24 NOTE — Telephone Encounter (Signed)
Pt has been submitted to Prolia Portal

## 2020-04-25 NOTE — Telephone Encounter (Signed)
This has been noted in the Prolia book and will await summary of benefits.  Will begin verification of Reclast.

## 2020-05-01 NOTE — Telephone Encounter (Signed)
Received summary of benefits for Prolia. Medication does not require a PA, but patient is responsible for 20% coinsurance up to a $3400 out of pocket max ($2529.53 met). Patient also responsible for $35 administration fee.  Patient's cost to receive prolia would be $870.47.  Called pt and informed her of this. Pt stated that this was not a financial possibility or her. She was given the phone number for the [place] and she will check into this and call this office back.

## 2020-05-04 ENCOUNTER — Encounter: Payer: Self-pay | Admitting: Endocrinology

## 2020-05-04 NOTE — Telephone Encounter (Signed)
Patient wanting to get a grant for prolia and patient needs a Dx code for this - patient also wanted to speak to Dr/nurse about the reclast. Patient wondering if there was any medication she could be on while waiting for prolia. Patient ph# [phone].

## 2020-05-04 NOTE — Telephone Encounter (Signed)
Pt also sent the following My Chart message which has been routed to Noah:  Hi Dr. Everardo All, I am trying to get financial help to be able to get prolia and Reclast that I need . I have to be able to pay $880 before you will be able to give me the medication so I can have the spinal surgery. Every organization I call need a ICD code. Also I need a diagnosis. Thank you so much. I need this medication I just hope I can get the help so I can get the medication and have my surgery that I need.  Please let me know the information ASAP. Thank you Alison Monroe

## 2020-05-05 NOTE — Telephone Encounter (Signed)
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

## 2020-05-05 NOTE — Telephone Encounter (Signed)
Responded to pt via mychart

## 2020-05-08 NOTE — Telephone Encounter (Signed)
Please refer to pt message below 

## 2020-05-09 ENCOUNTER — Other Ambulatory Visit: Payer: Self-pay | Admitting: Endocrinology

## 2020-05-09 DIAGNOSIS — M810 Age-related osteoporosis without current pathological fracture: Secondary | ICD-10-CM

## 2020-05-10 ENCOUNTER — Telehealth: Payer: Self-pay

## 2020-05-10 NOTE — Telephone Encounter (Signed)
Called pt's insurance to verify coverage for Reclast.   Pt is required to pay a 20% coinsurance along with a $35 copay for the visit.  Also has a maximum out of pocket expense of $3,400 and she has met $2,529.53. This leaves a difference of $870.47. Pt's insurance does not require a Prior Authorization. Pt notified via MyChart and waiting to verify exact cost for pt and will notify her of her financial responsibility.

## 2020-05-11 IMAGING — CT CT L SPINE W/O CM
3 series · 10 of 33 positions shown, 12 images · non-contrast
Comparison: No priors available.

CLINICAL DATA: Left-sided lumbar pain 3 days. Patient reports
twisting and felt a pop and her back. History of back surgery.
Broken rod in back. History of infection.

EXAM:
CT LUMBAR SPINE WITHOUT CONTRAST
TECHNIQUE: Multidetector CT imaging of the lumbar spine was performed without
intravenous contrast administration. Multiplanar CT image
reconstructions were also generated.

[Series 4: l spine soft (person_name) · axial · 0.33mm/px · z∈[-385,-253]mm · 2 of 143 slices shown, 3 images]
[im 44/143  soft-tissue]
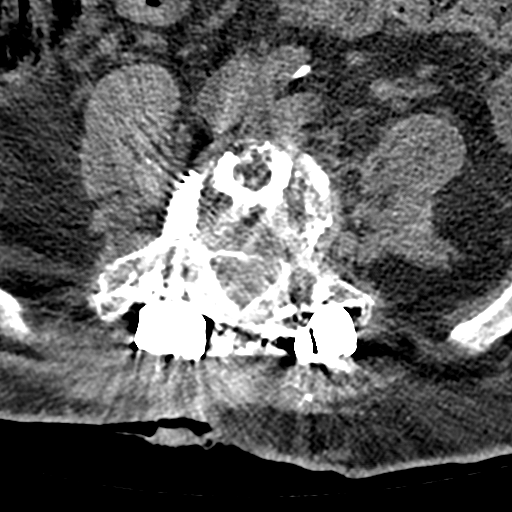
[im 44/143  bone]
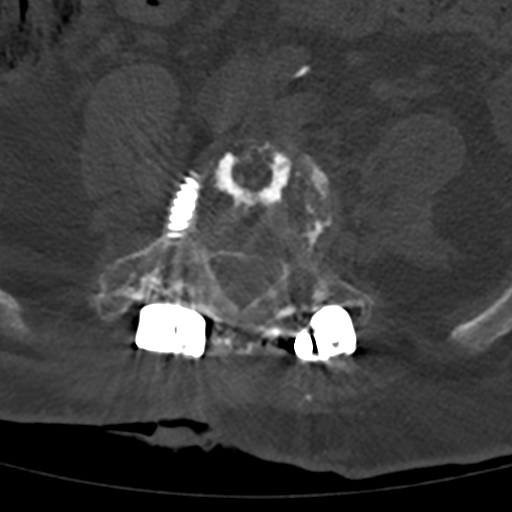
[im 110/143  bone]
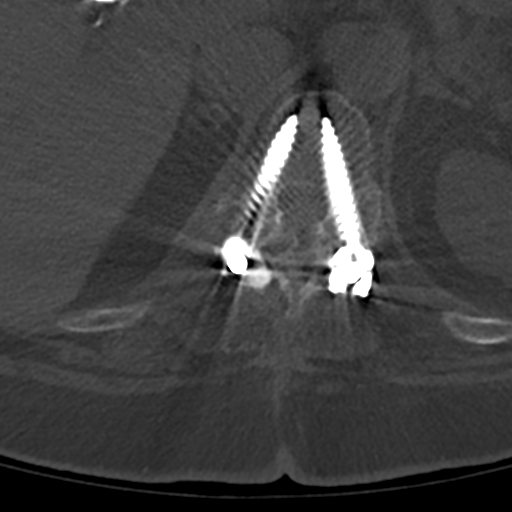

[Series 6: sagittal bone · sagittal · 0.35mm/px · 5 of 85 slices shown, 6 images]
[im 29/85  bone]
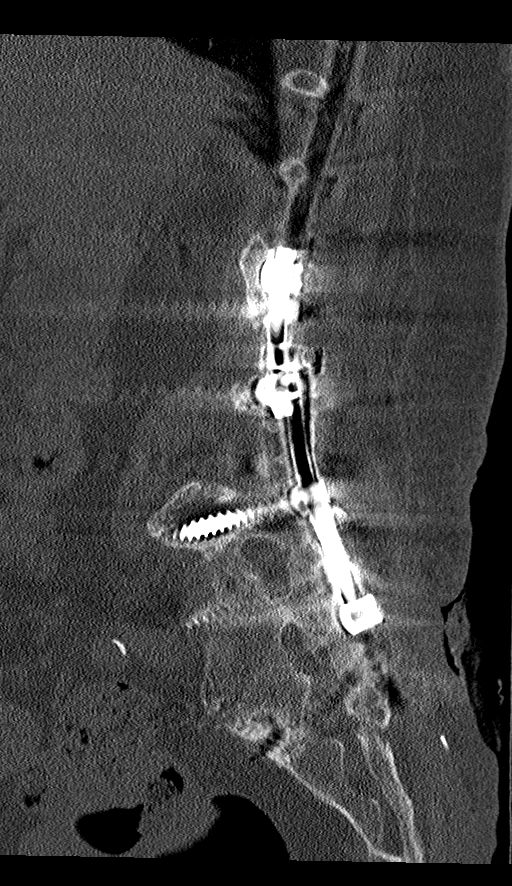
[im 36/85  bone]
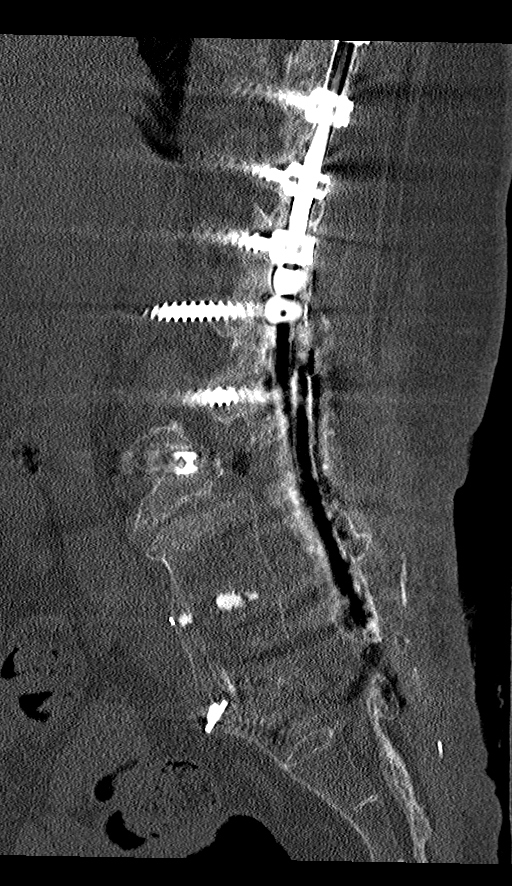
[im 43/85  soft-tissue]
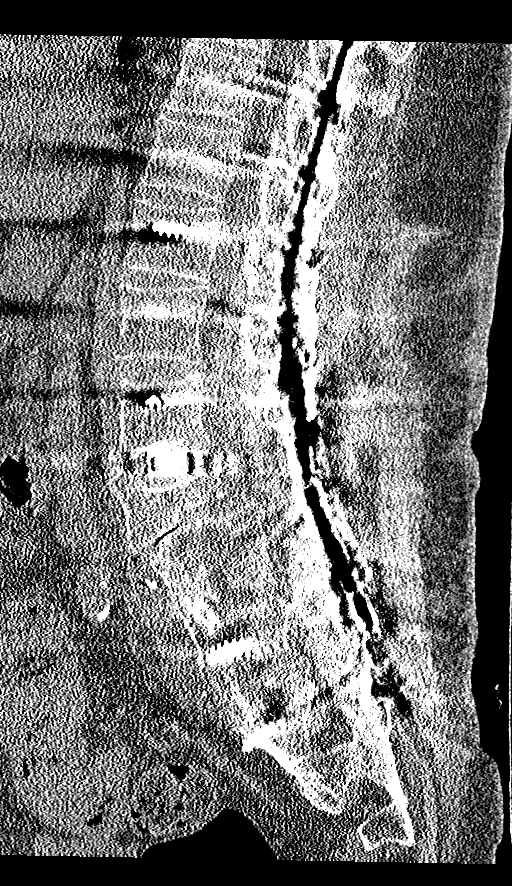
[im 43/85  bone]
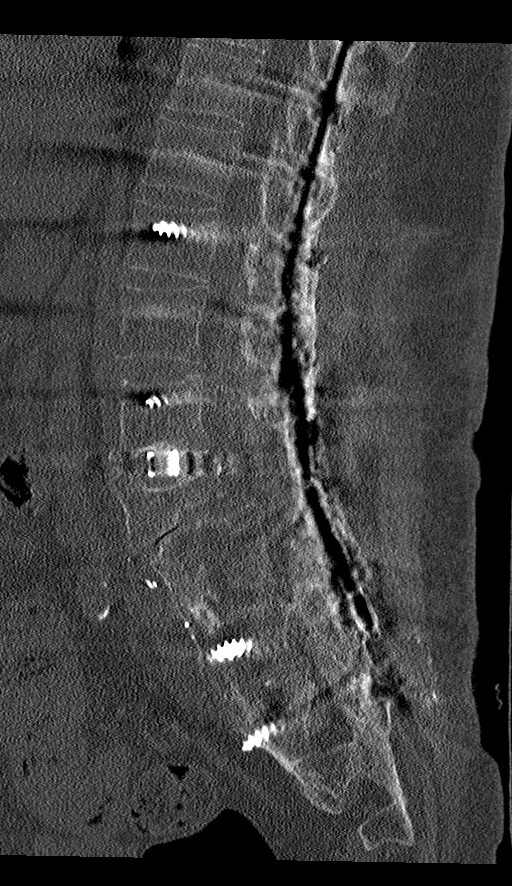
[im 50/85  bone]
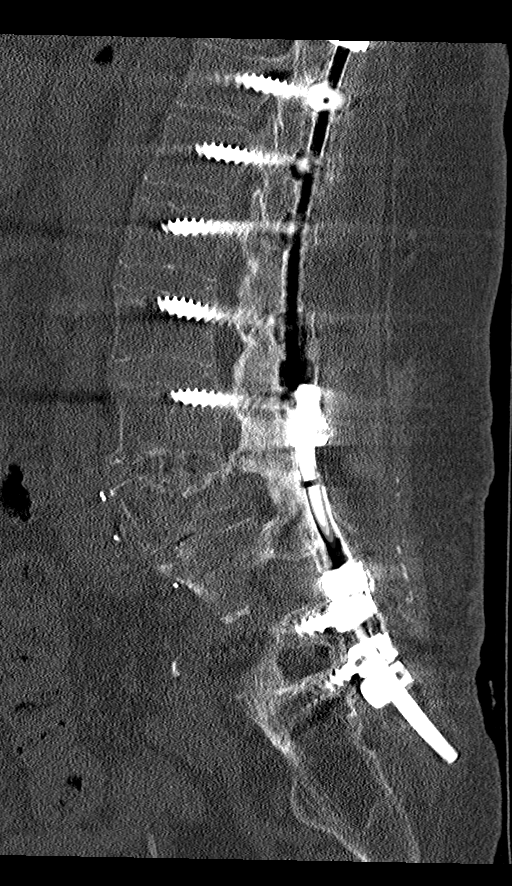
[im 57/85  bone]
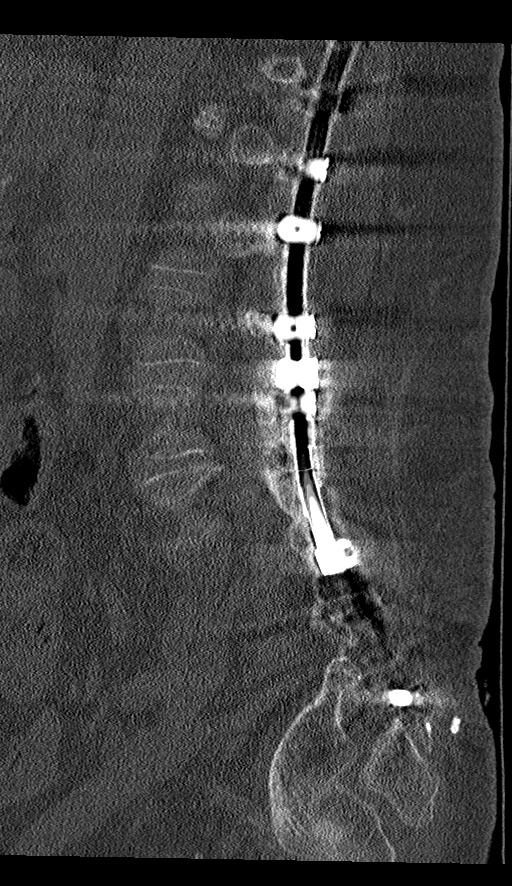

[Series 7: coronal bone · coronal · 0.33mm/px · 3 of 90 slices shown]
[im 18/90  bone]
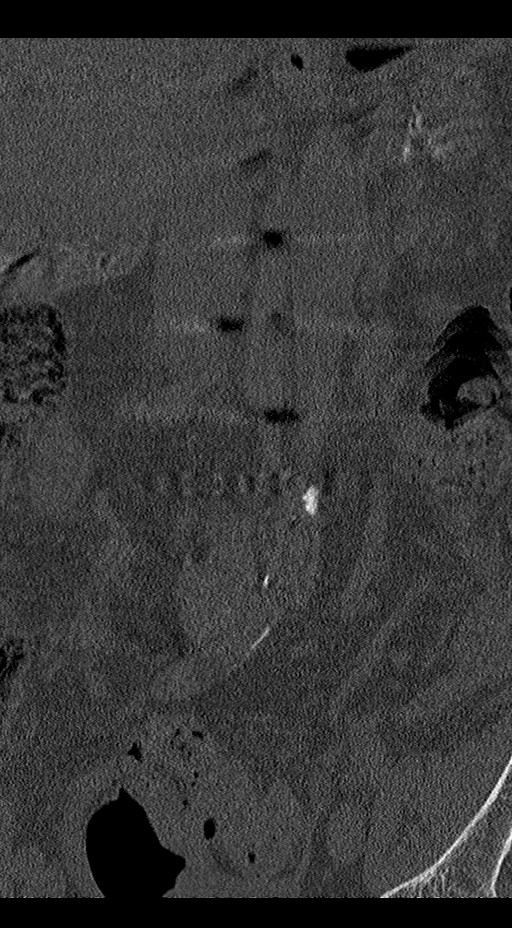
[im 36/90  bone]
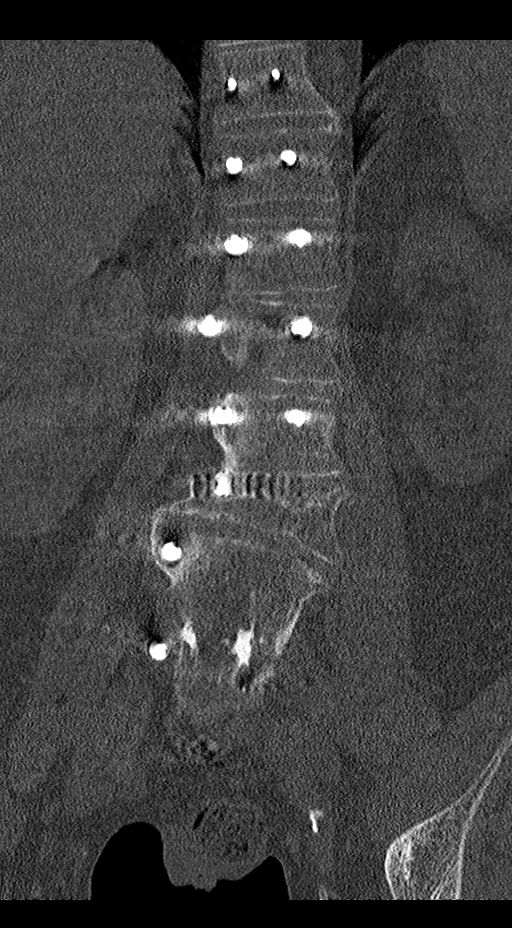
[im 54/90  bone]
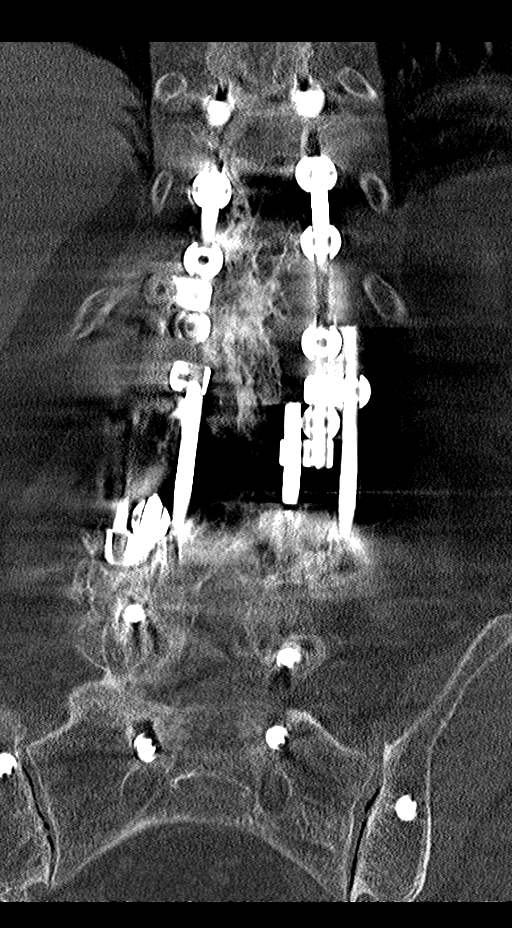

[10 of 33 positions shown; findings below may reference images not displayed]

The patient has been seen at
multiple outside facilities. Apparently surgery was at outside
facility.
FINDINGS: Segmentation: Normal.  Lowest disc space L5-S1

Alignment: 8 mm anterolisthesis L3-4.  Remaining alignment normal

Vertebrae: Moderately severe compression fracture of L3, probably
chronic.

Fracture line through the left L4 pedicle likely recent. It is
difficult to date this fracture without prior studies.

Bilateral pedicle screw fusion T9 through the iliac bone
bilaterally. Anterior fusion with screw and bone graft L5-S1.

Extensive lucency around the right S1 screw compatible with
loosening or infection.

Fracture of the left lower rod at the L3-4 level. This was reported
by the patient and history is being chronic.

Paraspinal and other soft tissues: Mild atherosclerotic aorta. No
paraspinous mass or fluid collection.

Disc levels: T10-11: Negative for stenosis. Bilateral facet
degeneration

T11-12: Negative for stenosis. Solid posterior fusion. Pedicle
screws in good position.

T12-L1: Right L1 screw is lateral to the pedicle in the paraspinous
soft tissues. Left-sided screw crosses the subarticular zone. No
significant stenosis.

L1-2: Solid posterior fusion. Hardware in good position. Negative
for stenosis.

L2-3: Interbody spacer in good position. Negative for stenosis.
Right L3 screw is lateral to the pedicle in the soft tissues. No
screw on the left at L3.

L4-5: Right-sided L5 screw is lateral to the pedicle. Solid
interbody fusion. Negative for stenosis.

L5-S1: Solid interbody fusion.  Negative for stenosis.
IMPRESSION: Multilevel lumbar fusion. Multiple hardware complications as above.
Lack of prior studies makes interpretation difficult.

Moderately severe compression fracture of L3  probably chronic.

Fracture through the left L4 pedicle appears recent and could be
acute or subacute. 8 mm anterolisthesis L3-4. Comparison with prior
studies would be useful.

Fracture of the left lower rod at the L3-4 level. Patient reports
this is a known finding.

Lucency around the right S1 screw which appears chronic and likely
due to loosening or infection.

I discussed the findings by telephone with Carla Bazile NP, and
will fax this report to her location in [place]

## 2020-05-12 ENCOUNTER — Other Ambulatory Visit: Payer: Self-pay

## 2020-05-12 DIAGNOSIS — M810 Age-related osteoporosis without current pathological fracture: Secondary | ICD-10-CM

## 2020-05-12 NOTE — Telephone Encounter (Signed)
Please refer to pt request for the address where she would like her orders sent.

## 2020-05-12 NOTE — Addendum Note (Signed)
Addended by: Adline Mango I on: 05/12/2020 09:52 AM   Modules accepted: Orders

## 2020-05-17 LAB — BASIC METABOLIC PANEL
BUN/Creatinine Ratio: 26 — ABNORMAL HIGH (ref 9–23)
BUN: 41 mg/dL — ABNORMAL HIGH (ref 6–24)
CO2: 30 mmol/L — ABNORMAL HIGH (ref 20–29)
Calcium: 9.8 mg/dL (ref 8.7–10.2)
Chloride: 98 mmol/L (ref 96–106)
Creatinine, Ser: 1.57 mg/dL — ABNORMAL HIGH (ref 0.57–1.00)
GFR calc Af Amer: 42 mL/min/{1.73_m2} — ABNORMAL LOW (ref >59)
GFR calc non Af Amer: 36 mL/min/{1.73_m2} — ABNORMAL LOW (ref >59)
Glucose: 149 mg/dL — ABNORMAL HIGH (ref 65–99)
Potassium: 4.1 mmol/L (ref 3.5–5.2)
Sodium: 141 mmol/L (ref 134–144)

## 2020-05-19 ENCOUNTER — Other Ambulatory Visit: Payer: Self-pay | Admitting: Endocrinology

## 2020-05-19 ENCOUNTER — Other Ambulatory Visit: Payer: Self-pay

## 2020-05-19 ENCOUNTER — Encounter: Payer: Self-pay | Admitting: Endocrinology

## 2020-05-19 MED ORDER — IBANDRONATE SODIUM 150 MG PO TABS: 150.0000 mg | ORAL_TABLET | ORAL | 3 refills | Status: DC

## 2020-05-22 ENCOUNTER — Other Ambulatory Visit: Payer: Self-pay

## 2020-05-22 ENCOUNTER — Ambulatory Visit (HOSPITAL_COMMUNITY): Payer: Medicare Other | Attending: Cardiovascular Disease

## 2020-05-22 ENCOUNTER — Other Ambulatory Visit (HOSPITAL_COMMUNITY): Payer: Medicare Other

## 2020-05-22 DIAGNOSIS — I480 Paroxysmal atrial fibrillation: Secondary | ICD-10-CM | POA: Diagnosis not present

## 2020-05-22 DIAGNOSIS — I5189 Other ill-defined heart diseases: Secondary | ICD-10-CM

## 2020-05-22 MED ORDER — PERFLUTREN LIPID MICROSPHERE
1.0000 mL | INTRAVENOUS | Status: AC | PRN
  Administered 2020-05-22: 3 mL via INTRAVENOUS

## 2020-05-22 NOTE — Telephone Encounter (Signed)
Patient called requesting to speak to [facility] regarding the reclast and prolia. [phone]

## 2020-05-23 NOTE — Telephone Encounter (Signed)
This has been handled via MyChart encounter. See MyChart correspondence for further details.

## 2020-05-24 ENCOUNTER — Telehealth: Payer: Self-pay | Admitting: Family Medicine

## 2020-05-24 ENCOUNTER — Telehealth (INDEPENDENT_AMBULATORY_CARE_PROVIDER_SITE_OTHER): Payer: Medicare Other | Admitting: Family Medicine

## 2020-05-24 ENCOUNTER — Encounter: Payer: Self-pay | Admitting: Family Medicine

## 2020-05-24 DIAGNOSIS — E1122 Type 2 diabetes mellitus with diabetic chronic kidney disease: Secondary | ICD-10-CM | POA: Insufficient documentation

## 2020-05-24 DIAGNOSIS — N1831 Chronic kidney disease, stage 3a: Secondary | ICD-10-CM | POA: Diagnosis not present

## 2020-05-24 DIAGNOSIS — E1121 Type 2 diabetes mellitus with diabetic nephropathy: Secondary | ICD-10-CM | POA: Diagnosis not present

## 2020-05-24 DIAGNOSIS — M4636 Infection of intervertebral disc (pyogenic), lumbar region: Secondary | ICD-10-CM

## 2020-05-24 DIAGNOSIS — G894 Chronic pain syndrome: Secondary | ICD-10-CM

## 2020-05-24 DIAGNOSIS — M810 Age-related osteoporosis without current pathological fracture: Secondary | ICD-10-CM | POA: Diagnosis not present

## 2020-05-24 NOTE — Telephone Encounter (Signed)
Patient also mentioned that Dr.Andy wanted to do a 3 month follow up, patient would like to know if it could be virtual.

## 2020-05-24 NOTE — Progress Notes (Signed)
Virtual Visit via Video Note  Subjective  CC:  Chief Complaint  Patient presents with  . Diabetes    denies checking glucose at home  . Anemia  . Referral    requesting home health agency daily when she returns back from [place]     I connected with Landry Mellow on 05/24/20 at  9:00 AM EDT by a video enabled telemedicine application and verified that I am speaking with the correct person using two identifiers. Location patient: Home Location provider: [facility] participating in the virtual visit: Kathrynne Talor [place], Leamon Arnt, MD  Reymundo Poll, [place] discussed the limitations of evaluation and management by telemedicine and the availability of in person appointments. The patient expressed understanding and agreed to proceed. HPI: Alison Monroe is a 59 y.o. female who was contacted today to address the problems listed above in the chief complaint, f/u diabetes:  Diabetes follow up: Her diabetic control is reported as Improved. However she was supposed to be in the office for recheck. She is going back to [place] next week and will see her PCP there for DM f/u and a1c. She will send me the results. She denies sxs of hyperglycemia.  She denies exertional CP or SOB or symptomatic hypoglycemia. She denies foot sores or paresthesias.  Back pain/wounds: seeing [facility] for wound care. Seeing specialist and still trying to get in with new pain specialist.   Traveling to [facility] for the next month or so.   Osteoporosis: starting reclast and prolia per endocrine. Notes reviewed.   Immunization History  Administered Date(s) Administered  . Influenza, Quadrivalent, Recombinant, Inj, Pf 08/20/2017  . Influenza-Unspecified 09/10/2016, 08/30/2019  . Moderna SARS-COVID-2 Vaccination 03/03/2020, 04/15/2020  . Pneumococcal Conjugate-13 09/17/2017  . Pneumococcal Polysaccharide-23 10/13/2018    Diabetes Related Lab Review: Lab Results    Component Value Date   HGBA1C 6.0 01/12/2019   a1c 6.6 3 months ago.   Lab Results  Component Value Date   MICROALBUR <0.7 01/12/2019   Lab Results  Component Value Date   CREATININE 1.57 (H) 05/16/2020   BUN 41 (H) 05/16/2020   NA 141 05/16/2020   K 4.1 05/16/2020   CL 98 05/16/2020   CO2 30 (H) 05/16/2020   Lab Results  Component Value Date   CHOL 182 01/12/2019   Lab Results  Component Value Date   HDL 56.10 01/12/2019   Lab Results  Component Value Date   LDLCALC 113 (H) 01/12/2019   Lab Results  Component Value Date   TRIG 64.0 01/12/2019   Lab Results  Component Value Date   CHOLHDL 3 01/12/2019   No results found for: LDLDIRECT The 10-year ASCVD risk score Mikey Bussing DC Jr., et al., 2013) is: 6.4%   Values used to calculate the score:     Age: 59 years     Sex: Female     Is Non-Hispanic African American: No     Diabetic: Yes     Tobacco smoker: No     Systolic Blood Pressure: 481 mmHg     Is BP treated: No     HDL Cholesterol: 42 mg/dL     Total Cholesterol: 171 mg/dL  BP Readings from Last 3 Encounters:  04/17/20 114/64  03/09/20 138/78  02/21/20 132/74   Wt Readings from Last 3 Encounters:  04/17/20 216 lb (98 kg)  03/09/20 223 lb 9.6 oz (101.4 kg)  02/21/20 235 lb 3.2  oz (106.7 kg)    Health Maintenance  Topic Date Due  . PAP SMEAR-Modifier  05/24/2020 (Originally 07/14/1982)  . OPHTHALMOLOGY EXAM  05/24/2020 (Originally 07/15/1971)  . MAMMOGRAM  12/05/2020 (Originally 05/17/2019)  . FOOT EXAM  12/15/2020 (Originally 07/15/1971)  . INFLUENZA VACCINE  07/16/2020  . HEMOGLOBIN A1C  08/24/2020  . TETANUS/TDAP  12/17/2024  . COLONOSCOPY  12/17/2025  . PNEUMOCOCCAL POLYSACCHARIDE VACCINE AGE 62-64 HIGH RISK  Completed  . COVID-19 Vaccine  Completed  . Hepatitis C Screening  Discontinued  . HIV Screening  Discontinued    Assessment  1. Type 2 diabetes mellitus with stage 3a chronic kidney disease, without long-term current use of insulin (HCC)    2. Stage 3a chronic kidney disease   3. Infection of intervertebral disc (pyogenic), lumbar region (HCC)   4. Osteoporosis, unspecified osteoporosis type, unspecified pathological fracture presence   5. Chronic pain syndrome      Plan   Diabetes is currently clinically controlled. Need repeat a1c and she will get at her PCP in [place].   Reviewed recent bmp; renal function is stable.   Specialists are managing back pain, back wound. HH for wound care and in home assistance.  Osteoporosis starting meds now to help back healing.   Chronic narcotics.  Diabetic education: ongoing education regarding chronic disease management for diabetes was given today. We continue to reinforce the ABC's of diabetic management: A1c (<7 or 8 dependent upon patient), tight blood pressure control, and cholesterol management with goal LDL < 100 minimally. We discuss diet strategies, exercise recommendations, medication options and possible side effects. At each visit, we review recommended immunizations and preventive care recommendations for diabetics and stress that good diabetic control can prevent other problems. See below for this patient's data.  I discussed the assessment and treatment plan with the patient. The patient was provided an opportunity to ask questions and all were answered. The patient agreed with the plan and demonstrated an understanding of the instructions.   The patient was advised to call back or seek an in-person evaluation if the symptoms worsen or if the condition fails to improve as anticipated. Follow up: No follow-ups on file.  Visit date not found  No orders of the defined types were placed in this encounter.     I reviewed the patients updated PMH, FH, and SocHx.    Patient Active Problem List   Diagnosis Date Noted  . Type 2 diabetes mellitus with stage 3a chronic kidney disease, without long-term current use of insulin (HCC) 05/24/2020    Priority: High  .  Osteoporosis 04/17/2020    Priority: High  . Degeneration of lumbar intervertebral disc 12/17/2018    Priority: High  . Infection of intervertebral disc (pyogenic), lumbar region [facility]) 12/17/2018    Priority: High  . Narcotic dependence (HCC) 12/17/2018    Priority: High  . Peripheral venous insufficiency 12/17/2018    Priority: High  . Chronic kidney disease, stage 3 unspecified 12/17/2018    Priority: High  . Chronic bilateral low back pain with right-sided sciatica 12/17/2018    Priority: High  . Chronic pain syndrome 12/17/2018    Priority: High  . Knee osteoarthritis 12/17/2018    Priority: Medium  . MRSA (methicillin resistant Staphylococcus aureus) colonization 12/17/2018    Priority: Medium  . S/P bariatric surgery 12/17/2018    Priority: Medium  . Chronic rhinitis 12/21/2019    Priority: Low  . Fibrocystic breast changes 12/17/2018    Priority: Low  .  Gastro-esophageal reflux disease without esophagitis 12/17/2018    Priority: Low  . Vitamin B 12 deficiency 12/17/2018    Priority: Low  . S/P lumbar fusion 09/11/2016    Priority: Low  . Fusion of spine, thoracolumbar region 12/31/2019  . Multiple drug allergies 12/27/2019  . Trigger finger of left hand 06/21/2019  . Localized, primary osteoarthritis of hand 05/28/2019  . Loosening of hardware in spine (HCC) 01/26/2019  . Lumbar nerve root impingement 12/01/2018  . Lumbar foraminal stenosis 12/01/2018  . Spondylolisthesis of lumbar region 12/01/2018  . Infection of lumbar spine (HCC) 06/04/2016  . Leukocytosis 05/05/2016  . Status post total right knee replacement 10/19/2015   Current Meds  Medication Sig  . acetaminophen (TYLENOL) 500 MG tablet Take 1,000 mg by mouth 2 (two) times daily as needed.  Marland Kitchen apixaban (ELIQUIS) 5 MG TABS tablet Take 1 tablet (5 mg total) by mouth 2 (two) times daily.  . baclofen (LIORESAL) 20 MG tablet Take 20 mg by mouth 3 (three) times daily.   . cyanocobalamin (,VITAMIN B-12,) 1000  MCG/ML injection Inject 1 mL into the skin.  Marland Kitchen diclofenac Sodium (VOLTAREN) 1 % GEL APPLY TOPICALLY 2 GRAMS TO  AFFECTED JOINT TWICE DAILY  . diphenhydrAMINE (BENADRYL) 50 MG capsule Take 50 mg by mouth as needed.  . doxycycline (MONODOX) 100 MG capsule Take 100 mg by mouth 2 (two) times daily.   Marland Kitchen escitalopram (LEXAPRO) 10 MG tablet Take 10 mg by mouth daily.  . fentaNYL (DURAGESIC - DOSED MCG/HR) 100 MCG/HR Place 1 patch onto the skin every other day.  Marland Kitchen HYDROmorphone (DILAUDID) 8 MG tablet Take 1 tablet by mouth every 6 (six) hours.   . Multiple Vitamin (MULTIVITAMIN) tablet Take 1 tablet by mouth daily.  Marland Kitchen omeprazole (PRILOSEC) 20 MG capsule Take 1 capsule by mouth 2 (two) times daily.  . potassium chloride (KLOR-CON) 8 MEQ tablet Take 8 mEq by mouth daily.  Marland Kitchen torsemide (DEMADEX) 20 MG tablet Take 0.5 tablets (10 mg total) by mouth daily.    Allergies: Patient is allergic to dextran; erythromycin; erythromycin base; metronidazole; other; penicillins; vancomycin; adhesive  [tape]; and ampicillin. Family History: Patient family history includes Arthritis in her maternal grandmother and mother; Asthma in her maternal grandmother and mother; COPD in her father and mother; Cancer in her maternal grandfather; Depression in her mother; Diabetes in her brother, father, maternal grandmother, and mother; Early death in her father and mother; Heart attack in her father and mother; Heart disease in her brother and mother; Hyperlipidemia in her brother and mother; Hypertension in her brother, father, and mother; Kidney disease in her father; Stroke in her father. Social History:  Patient  reports that she has never smoked. She has never used smokeless tobacco. She reports that she does not drink alcohol or use drugs.  Review of Systems: Constitutional: Negative for fever malaise or anorexia Cardiovascular: negative for chest pain Respiratory: negative for SOB or persistent cough Gastrointestinal:  negative for abdominal pain  OBJECTIVE Vitals: There were no vitals taken for this visit. General: no acute distress , A&Ox3  Willow Ora, MD

## 2020-05-24 NOTE — Telephone Encounter (Signed)
Patient is calling in asking where she needs to go to have the a1c test done -can we schedule this in our office? also would like to sepak to someone about the referrals to the pain management doctors, states her brother has asked her to stay in [place] for a little longer.

## 2020-05-25 ENCOUNTER — Telehealth (INDEPENDENT_AMBULATORY_CARE_PROVIDER_SITE_OTHER): Payer: Medicare Other | Admitting: Internal Medicine

## 2020-05-25 ENCOUNTER — Encounter: Payer: Self-pay | Admitting: Internal Medicine

## 2020-05-25 VITALS — BP 126/76 | HR 81 | Wt 199.0 lb

## 2020-05-25 DIAGNOSIS — Z01810 Encounter for preprocedural cardiovascular examination: Secondary | ICD-10-CM

## 2020-05-25 DIAGNOSIS — Z7901 Long term (current) use of anticoagulants: Secondary | ICD-10-CM

## 2020-05-25 DIAGNOSIS — I5189 Other ill-defined heart diseases: Secondary | ICD-10-CM | POA: Diagnosis not present

## 2020-05-25 DIAGNOSIS — I480 Paroxysmal atrial fibrillation: Secondary | ICD-10-CM

## 2020-05-25 MED ORDER — APIXABAN 5 MG PO TABS: 5.0000 mg | ORAL_TABLET | Freq: Two times a day (BID) | ORAL | 1 refills | Status: DC

## 2020-05-25 NOTE — Patient Instructions (Signed)

## 2020-05-25 NOTE — Telephone Encounter (Signed)
Spoke with the patient and she stated that she is currently in [place]. She plans to go to [place] in August or September, she stated this is the reason why she wants to establish care with a pain management doctor here in [place] if not she'll have to travel to the one in [place] and that's a lot for her handle.

## 2020-05-25 NOTE — Telephone Encounter (Signed)
Pt said she would get her testing done in [place] Not sure what she wants to discuss about pan mgt doctors.  If she is in [place], will need to give [place] dates of her return.   I will see her back here in the office WHEN she returns from [place]. Her prior PCP in [place] will manage her when she is there.  thanks

## 2020-05-25 NOTE — Progress Notes (Signed)
Virtual Visit via Video Note   This visit type was conducted due to national recommendations for restrictions regarding the COVID-19 Pandemic (e.g. social distancing) in an effort to limit this patient's exposure and mitigate transmission in our community.  Due to her co-morbid illnesses, this patient is at least at moderate risk for complications without adequate follow up.  This format is felt to be most appropriate for this patient at this time.  All issues noted in this document were discussed and addressed.  A limited physical exam was performed with this format.  Please refer to the patient's chart for her consent to telehealth for [facility].   Evaluation Performed:  Caregility  Date:  05/25/2020   ID:  Alison Monroe, DOB 1961/01/31, MRN [id number]  Patient Location:  [address] [place] [address]  Provider location:   [address] [place], [place] [address]  PCP:  Willow Ora, MD  Cardiologist:  Chrystie Nose, MD Electrophysiologist:  None   Chief Complaint:  Follow-up echo  History of Present Illness:    Alison Monroe is a 59 y.o. female who presents via audio/video conferencing for a telehealth visit today.  Alison Monroe is a 59 y.o. female who is being seen today for the evaluation of right atrial mass at the request of Willow Ora, MD. This is a pleasant 59 year old female with unfortunate longstanding issues related to a spine injury secondary to fall number of years ago.  She is received care both in [place], [place] where she primarily resides, the [facility] and also at [facility] most recently.  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  This required removal of the hardware and antibiotic therapy.  During that hospitalization she was found to have a 2 x 2 centimeter mass in the right atrium concerning for  possible thrombus.  A cardiac MRI was performed which confirmed this and it was noted to be at the diaphragmatic portion of the right atrium adjacent to the IVC and in close proximity to the tricuspid valve.  It was not noted to extend in the SVC or IVC.  She had bilateral upper and lower extremity venous Dopplers in January at [facility] which were negative for thrombus and she has some paroxysmal atrial fibrillation noted during her hospitalization and she was started on Eliquis 5 mg twice daily.  The right atrial mass was noted in mid February.  Since then she is done fairly well except she does have a nonhealed wound in her back and will need a repeat surgery to restabilize the spine and close this wound.  Apparently this is depended on my recommendations for being able to come off anticoagulant therapy.  05/25/2020  Alison Monroe returns today for follow-up the video visit.  She underwent an echo which showed normal systolic function and resolution of the right atrial thrombus.  She has been anticoagulated on twice daily Eliquis.  At this point she would be at acceptable risk to undergo surgery and it would be okay for her to hold her anticoagulation however she tells me today that she has significant osteopenia or osteoporosis and that the surgeons wish to wait 6 months to a year on therapy to see if she has improvement in her bone density.  The patient does not have symptoms concerning for COVID-19 infection (fever, chills, cough, or new SHORTNESS OF  BREATH).    Prior CV studies:   The following studies were reviewed today:  Echo  PMHx:  Past Medical History:  Diagnosis Date  . Arthritis   . Asthma   . Chronic kidney disease    Stage 3  . Diabetes ([place])   . Heart murmur     Past Surgical History:  Procedure Laterality Date  . ANKLE SURGERY Left   . BREAST BIOPSY    . CATARACT EXTRACTION  2015  . [place]  . GASTRIC BYPASS    . LUMBAR SPINE SURGERY     Several. Rods,  bolts and screws in place  . TOTAL KNEE ARTHROPLASTY Right 2015    FAMHx:  Family History  Problem Relation Age of Onset  . Arthritis Mother   . Asthma Mother   . COPD Mother   . Depression Mother   . Diabetes Mother   . Early death Mother   . Heart disease Mother   . Hyperlipidemia Mother   . Hypertension Mother   . Heart attack Mother   . COPD Father   . Diabetes Father   . Early death Father   . Heart attack Father   . Kidney disease Father   . Hypertension Father   . Stroke Father   . Diabetes Brother   . Heart disease Brother   . Hyperlipidemia Brother   . Hypertension Brother   . Arthritis Maternal Grandmother   . Asthma Maternal Grandmother   . Diabetes Maternal Grandmother   . Cancer Maternal Grandfather   . Osteoporosis Neg Hx     SOCHx:   reports that she has never smoked. She has never used smokeless tobacco. She reports that she does not drink alcohol and does not use drugs.  ALLERGIES:  Allergies  Allergen Reactions  . Dextran Other (See Comments)  . Erythromycin Other (See Comments)    GI upset  . Erythromycin Base Other (See Comments)  . Metronidazole     Other reaction(s): Abdominal Pain  . Other Swelling    dexon sutures   . Penicillins   . Vancomycin Other (See Comments)    Kidney failure  . Adhesive  [Tape] Rash    Paper tape  . Ampicillin Swelling    MEDS:  Current Meds  Medication Sig  . acetaminophen (TYLENOL) 500 MG tablet Take 1,000 mg by mouth 2 (two) times daily as needed.  Marland Kitchen apixaban (ELIQUIS) 5 MG TABS tablet Take 1 tablet (5 mg total) by mouth 2 (two) times daily.  . baclofen (LIORESAL) 20 MG tablet Take 20 mg by mouth 3 (three) times daily.   . Cholecalciferol (VITAMIN D3 PO) Take by mouth in the morning, at noon, and at bedtime.  . cyanocobalamin (,VITAMIN B-12,) 1000 MCG/ML injection Inject 1 mL into the skin every 30 (thirty) days.   . diclofenac Sodium (VOLTAREN) 1 % GEL APPLY TOPICALLY 2 GRAMS TO  AFFECTED JOINT  TWICE DAILY  . diphenhydrAMINE (BENADRYL) 50 MG capsule Take 50 mg by mouth as needed.  . doxycycline (MONODOX) 100 MG capsule Take 100 mg by mouth 2 (two) times daily.   Marland Kitchen escitalopram (LEXAPRO) 10 MG tablet Take 10 mg by mouth daily.  . fentaNYL (DURAGESIC - DOSED MCG/HR) 100 MCG/HR Place 1 patch onto the skin every other day.  Marland Kitchen HYDROmorphone (DILAUDID) 8 MG tablet Take 1 tablet by mouth every 6 (six) hours.   . Multiple Vitamin (MULTIVITAMIN) tablet Take 1 tablet by mouth daily.  Marland Kitchen omeprazole (  PRILOSEC) 20 MG capsule Take 1 capsule by mouth in the morning, at noon, and at bedtime.   . potassium chloride (KLOR-CON) 8 MEQ tablet Take 8 mEq by mouth daily as needed.   . torsemide (DEMADEX) 20 MG tablet Take 0.5 tablets (10 mg total) by mouth daily.  . [DISCONTINUED] apixaban (ELIQUIS) 5 MG TABS tablet Take 1 tablet (5 mg total) by mouth 2 (two) times daily.     ROS: Pertinent items noted in HPI and remainder of comprehensive ROS otherwise negative.  Labs/Other Tests and Data Reviewed:    Recent Labs: 12/22/2019: Hemoglobin 11.4; Platelets 330 04/17/2020: TSH 2.66 05/16/2020: BUN 41; Creatinine, Ser 1.57; Potassium 4.1; Sodium 141   Recent Lipid Panel Lab Results  Component Value Date/Time   CHOL 182 01/12/2019 11:02 AM   TRIG 64.0 01/12/2019 11:02 AM   HDL 56.10 01/12/2019 11:02 AM   CHOLHDL 3 01/12/2019 11:02 AM   LDLCALC 113 (H) 01/12/2019 11:02 AM    Wt Readings from Last 3 Encounters:  05/25/20 199 lb (90.3 kg)  04/17/20 216 lb (98 kg)  03/09/20 223 lb 9.6 oz (101.4 kg)     Exam:    Vital Signs:  BP 126/76   Pulse 81   Wt 199 lb (90.3 kg)   BMI 34.16 kg/m    Deferred  ASSESSMENT & PLAN:    1. 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 Negative upper and LE dopplers in 12/2019 3. PAF - CHADSVASC score 2  Alison Monroe is seen today for video follow-up.  I personally reviewed her echo which showed resolution of the  right atrial thrombus.  At this point she could discontinue Eliquis 2 days prior to surgery if necessary however due to her osteoporosis or osteopenia, she will likely need to be on medication and her surgery will be delayed 6 months to a year to build up more bone.  We will plan follow-up in 1 year or sooner as necessary.  It is expected that she will be on lifelong anticoagulation.  COVID-19 Education: The signs and symptoms of COVID-19 were discussed with the patient and how to seek care for testing (follow up with PCP or arrange E-visit).  The importance of social distancing was discussed today.  Patient Risk:   After full review of this patients clinical status, I feel that they are at least moderate risk at this time.  Time:   Today, I have spent 15 minutes with the patient with telehealth technology discussing , preoperative risk assessment.     Medication Adjustments/Labs and Tests Ordered: Current medicines are reviewed at length with the patient today.  Concerns regarding medicines are outlined above.   Tests Ordered: No orders of the defined types were placed in this encounter.   Medication Changes: Meds ordered this encounter  Medications  . apixaban (ELIQUIS) 5 MG TABS tablet    Sig: Take 1 tablet (5 mg total) by mouth 2 (two) times daily.    Dispense:  180 tablet    Refill:  1    Disposition:  in 1 year(s)  Chrystie Nose, MD, [facility], FACP  [place]  [facility]  Medical Director of the Advanced Lipid Disorders &  Cardiovascular Risk Reduction Clinic Diplomate of the American Board of Clinical Lipidology Attending Cardiologist  Direct Dial: [phone]  Fax: [phone]  Website:  [email or web address]  Chrystie Nose, MD  05/25/2020 3:27 PM

## 2020-05-26 ENCOUNTER — Encounter: Payer: Self-pay | Admitting: Family Medicine

## 2020-05-26 ENCOUNTER — Ambulatory Visit: Payer: Medicare Other

## 2020-05-26 ENCOUNTER — Other Ambulatory Visit: Payer: Self-pay

## 2020-05-26 ENCOUNTER — Ambulatory Visit (INDEPENDENT_AMBULATORY_CARE_PROVIDER_SITE_OTHER): Payer: Medicare Other

## 2020-05-26 VITALS — BP 112/70 | HR 79 | Ht 64.0 in | Wt 212.2 lb

## 2020-05-26 DIAGNOSIS — M810 Age-related osteoporosis without current pathological fracture: Secondary | ICD-10-CM

## 2020-05-26 DIAGNOSIS — I513 Intracardiac thrombosis, not elsewhere classified: Secondary | ICD-10-CM | POA: Insufficient documentation

## 2020-05-26 HISTORY — DX: Intracardiac thrombosis, not elsewhere classified: I51.3

## 2020-05-26 MED ORDER — ZOLEDRONIC ACID 5 MG/100ML IV SOLN
5.0000 mg | Freq: Once | INTRAVENOUS | Status: AC
  Administered 2020-05-26: 5 mg via INTRAVENOUS

## 2020-05-26 NOTE — Telephone Encounter (Signed)
Please f/u with stephanie on pain mgt referral. They have been ordered.  Please order a1c and have her come in for the lab draw.  And I recommend in office visit in 3 months.

## 2020-05-26 NOTE — Progress Notes (Signed)
  Reclast Infusion  Patient presented to office at for Reclast infusion for Osteoporosis.  Pt was weighed and noted to be 212.2lbs. Pt then taken to infusion room. Vital signs assessed and noted to be WNL. IV was started at 11:15 am in right antecubital space using 22 gauge, 1 inch catheter. IV was inserted and flashback noted upon insertion. IV secured and flushed using 10cc NS flush. Insertion site inspected and noted to be clean and dry. No erythema noted.  IV tubing was primed and connected to IV catheter and set to infuse over 30-45 minutes. Pt voiced no complaints or concerns. No s/s of infiltration noted.  Assessed access site at 11:50am, and site noted to be patent, and infusing well.  Infusion completed at 11:50am. Access site was flushed with additional 10cc of NS, and then discontinued. Catheter noted to be intact upon inspection. Vital signs assessed after completion and noted to be WNL. BP- 122/70, P- 79, normal rate and rhythm, O2- 92%,RR- 16  Pt voiced no complaints or concerns at this time. Pt was then discharged from office. J.Murphy,RN on site for supervision.

## 2020-05-29 ENCOUNTER — Encounter: Payer: Self-pay | Admitting: Endocrinology

## 2020-05-30 ENCOUNTER — Ambulatory Visit (INDEPENDENT_AMBULATORY_CARE_PROVIDER_SITE_OTHER): Payer: Medicare Other

## 2020-05-30 ENCOUNTER — Other Ambulatory Visit: Payer: Self-pay

## 2020-05-30 DIAGNOSIS — M810 Age-related osteoporosis without current pathological fracture: Secondary | ICD-10-CM

## 2020-05-30 MED ORDER — DENOSUMAB 60 MG/ML ~~LOC~~ SOSY
60.0000 mg | PREFILLED_SYRINGE | Freq: Once | SUBCUTANEOUS | Status: AC
Start: 2020-05-30 — End: 2020-05-30
  Administered 2020-05-30: 60 mg via SUBCUTANEOUS

## 2020-05-30 NOTE — Progress Notes (Signed)
After obtaining consent, and per orders of Dr. Everardo All, injection of Prolia 60 mg given by Derinda Late. Patient instructed to remain in clinic for 20 minutes afterwards, and to report any adverse reaction to me immediately.

## 2020-06-01 ENCOUNTER — Encounter: Payer: Self-pay | Admitting: Endocrinology

## 2020-06-07 ENCOUNTER — Telehealth: Payer: Self-pay | Admitting: Internal Medicine

## 2020-06-07 NOTE — Telephone Encounter (Signed)
Returned call to patient she stated she will bring patient assistance forms for Dr.Hilty to sign.She will bring her part to fax all together.She will bring forms 6/24.Advised I will send message to Dr.Hilty's RN.

## 2020-06-07 NOTE — Telephone Encounter (Signed)
The patient called and said she needs Dr. Rennis Golden to fill out a form for her application to the Eliquis Patient Assistance Program with [employer].  The patient has the form but she would like to know how to get it to Dr. Rennis Golden. She will need Dr. Blanchie Dessert part filled out before she can finish her application to [employer].  Please let the patient know what is the best way for her to get the form to him

## 2020-06-09 NOTE — Telephone Encounter (Signed)
Patient dropped off forms for eliquis patient assistance on 06/09/20. She is aware MD is not back in office until 7/8

## 2020-06-21 DIAGNOSIS — M79641 Pain in right hand: Secondary | ICD-10-CM | POA: Insufficient documentation

## 2020-06-22 NOTE — Telephone Encounter (Signed)
Patient aware eliquis assistance application has been faxed. Copy of MD portion page mailed to her, per request.   She needs trigger finger surgery with Dr. Amanda Pea - asked that a formal clearance request be faxed to our office so everything is documented appropriately for pre-op request.

## 2020-06-23 ENCOUNTER — Telehealth: Payer: Self-pay

## 2020-06-23 NOTE — Telephone Encounter (Signed)
Patient with diagnosis of atrial fibrillation on Eliquis for anticoagulation.    Procedure: right index finger A1 pulley release Date of procedure: tbd  CHADS2-VASc score of  2 (DM2, female)  CrCl 59.4 Platelet count 330  Per office protocol, patient can hold Eliquis for 1 day prior to procedure.    Patient should restart Eliquis on the day after

## 2020-06-23 NOTE — Telephone Encounter (Signed)
   [facility] Pre-operative Risk Assessment    HEARTCARE STAFF: - Please ensure there is not already an duplicate clearance open for this procedure. - Under Visit Info/Reason for Call, type in Other and utilize the format Clearance MM/DD/YY or Clearance TBD. Do not use dashes or single digits. - If request is for dental extraction, please clarify the # of teeth to be extracted.  Request for surgical clearance:  1. What type of surgery is being performed? RIGHT INDEX FINGER A1 PULLEY RELEASE   2. When is this surgery scheduled? TBD   3. What type of clearance is required (medical clearance vs. Pharmacy clearance to hold med vs. Both)? BOTH  4. Are there any medications that need to be held prior to surgery and how long?ELIQUIS DAY BEFORE AND DAY OF AND WHEN TO START AFTER.   5. Practice name and name of physician performing surgery? Alison Monroe  ATTN:SHERRY    6. What is the office phone number? [phone]   7.   What is the office fax number? [phone]  8.   Anesthesia type (None, local, MAC, general) ? LM FOR EMERGE SCHEDULER TO CALL BACK WITH INFO.   Alison Monroe 06/23/2020, 8:17 AM

## 2020-06-23 NOTE — Telephone Encounter (Signed)
   Primary Cardiologist: Chrystie Nose, MD  Chart reviewed as part of pre-operative protocol coverage. Patient was contacted 06/23/2020 in reference to pre-operative risk assessment for pending surgery as outlined below.  Alison Monroe was last seen on 05/25/2020 by Dr. Rennis Golden.  Since that day, Alison Monroe has done well without chest pain or shortness of breath.  Therefore, based on ACC/AHA guidelines, the patient would be at acceptable risk for the planned procedure without further cardiovascular testing.   Clinical pharmacist to review Eliquis. Once hear back from PharmD, will forward final clearance letter to the requesting provider  Azalee Course, PA 06/23/2020, 2:52 PM

## 2020-06-26 NOTE — Telephone Encounter (Signed)
Per [employer], patient portion of application of was not received via fax on 06/22/20. Faxed this to [employer] for them to process application

## 2020-06-27 NOTE — Telephone Encounter (Signed)
Patient is approved for assistance from [employer] for Eliquis from 06/26/2020 - 12/15/2020

## 2020-06-29 ENCOUNTER — Encounter: Payer: Self-pay | Admitting: Endocrinology

## 2020-07-04 ENCOUNTER — Other Ambulatory Visit: Payer: Self-pay | Admitting: Endocrinology

## 2020-07-04 DIAGNOSIS — M810 Age-related osteoporosis without current pathological fracture: Secondary | ICD-10-CM

## 2020-07-05 ENCOUNTER — Telehealth: Payer: Self-pay | Admitting: Family Medicine

## 2020-07-05 NOTE — Telephone Encounter (Signed)
Alison Monroe with [facility] called with patients pain levels. Patient states her pain level is an 8 complaining of new right hip pain

## 2020-07-06 NOTE — Telephone Encounter (Signed)
Patient has sent MyChart message to Dr. Everardo All c/o bone and joint pain. Dr. Everardo All has ordered labs but have not been completed. Is this something we need to address in the office or wait until labs are done to see what Dr. Everardo All advises?

## 2020-07-06 NOTE — Telephone Encounter (Signed)
Spoke with patient, aware that Dr. Everardo All has ordered labs.

## 2020-07-06 NOTE — Telephone Encounter (Signed)
Alison Monroe, I read her my chart message and it looks like having a reaction from her prolia and reclast injections. Dr. Everardo All has ordered calcium to be checked and we just need to make sure this is not critical. Make sure she gets this done today. If pain is so severe she can not cope would go to ER.

## 2020-07-07 ENCOUNTER — Other Ambulatory Visit: Payer: Self-pay

## 2020-07-07 DIAGNOSIS — M810 Age-related osteoporosis without current pathological fracture: Secondary | ICD-10-CM

## 2020-07-08 LAB — PTH, INTACT AND CALCIUM
Calcium: 8.1 mg/dL — ABNORMAL LOW (ref 8.7–10.2)
PTH: 70 pg/mL — ABNORMAL HIGH (ref 15–65)

## 2020-07-08 LAB — VITAMIN D 25 HYDROXY (VIT D DEFICIENCY, FRACTURES): Vit D, 25-Hydroxy: 20.9 ng/mL — ABNORMAL LOW (ref 30.0–100.0)

## 2020-07-10 ENCOUNTER — Emergency Department (HOSPITAL_COMMUNITY)
Admission: EM | Admit: 2020-07-10 | Discharge: 2020-07-11 | Disposition: A | Discharge status: Home / Self Care | Payer: Medicare Other | Attending: Emergency Medicine | Admitting: Emergency Medicine

## 2020-07-10 ENCOUNTER — Encounter (HOSPITAL_COMMUNITY): Payer: Self-pay | Admitting: Emergency Medicine

## 2020-07-10 ENCOUNTER — Other Ambulatory Visit: Payer: Self-pay

## 2020-07-10 ENCOUNTER — Emergency Department (HOSPITAL_COMMUNITY): Payer: Medicare Other

## 2020-07-10 DIAGNOSIS — Z0000 Encounter for general adult medical examination without abnormal findings: Secondary | ICD-10-CM | POA: Insufficient documentation

## 2020-07-10 DIAGNOSIS — W19XXXA Unspecified fall, initial encounter: Secondary | ICD-10-CM | POA: Insufficient documentation

## 2020-07-10 DIAGNOSIS — S0993XA Unspecified injury of face, initial encounter: Secondary | ICD-10-CM | POA: Insufficient documentation

## 2020-07-10 DIAGNOSIS — Z7901 Long term (current) use of anticoagulants: Secondary | ICD-10-CM | POA: Diagnosis not present

## 2020-07-10 DIAGNOSIS — Z5321 Procedure and treatment not carried out due to patient leaving prior to being seen by health care provider: Secondary | ICD-10-CM | POA: Diagnosis not present

## 2020-07-10 DIAGNOSIS — S0990XA Unspecified injury of head, initial encounter: Secondary | ICD-10-CM | POA: Diagnosis not present

## 2020-07-10 DIAGNOSIS — S0083XA Contusion of other part of head, initial encounter: Secondary | ICD-10-CM | POA: Insufficient documentation

## 2020-07-10 NOTE — ED Triage Notes (Signed)
Pt reports that she fell tonight and hit her head, denies LOC, A&O x 4. Verbal orders CT from Dr. Adela Lank.

## 2020-07-11 ENCOUNTER — Telehealth: Payer: Self-pay | Admitting: Family Medicine

## 2020-07-11 ENCOUNTER — Other Ambulatory Visit: Payer: Self-pay

## 2020-07-11 ENCOUNTER — Ambulatory Visit
Admission: EM | Admit: 2020-07-11 | Discharge: 2020-07-11 | Disposition: A | Discharge status: Home / Self Care | Payer: Medicare Other | Attending: Physician Assistant | Admitting: Physician Assistant

## 2020-07-11 ENCOUNTER — Ambulatory Visit (INDEPENDENT_AMBULATORY_CARE_PROVIDER_SITE_OTHER): Payer: Medicare Other

## 2020-07-11 ENCOUNTER — Other Ambulatory Visit: Payer: Self-pay | Admitting: Endocrinology

## 2020-07-11 DIAGNOSIS — M25561 Pain in right knee: Secondary | ICD-10-CM

## 2020-07-11 DIAGNOSIS — M25511 Pain in right shoulder: Secondary | ICD-10-CM | POA: Diagnosis not present

## 2020-07-11 DIAGNOSIS — W19XXXA Unspecified fall, initial encounter: Secondary | ICD-10-CM | POA: Diagnosis not present

## 2020-07-11 DIAGNOSIS — R52 Pain, unspecified: Secondary | ICD-10-CM

## 2020-07-11 DIAGNOSIS — M25551 Pain in right hip: Secondary | ICD-10-CM

## 2020-07-11 DIAGNOSIS — M810 Age-related osteoporosis without current pathological fracture: Secondary | ICD-10-CM

## 2020-07-11 DIAGNOSIS — S022XXA Fracture of nasal bones, initial encounter for closed fracture: Secondary | ICD-10-CM

## 2020-07-11 NOTE — ED Triage Notes (Signed)
Pt states her knee gave out yesterday and she fell face first on the hardwood floor. States went to ED last night and had a CT scan but LWBS d/t wait time. States head CT was neg. Denies LOC. Pt c/o rt shoulder, rt hip, and rt knee pain.

## 2020-07-11 NOTE — Telephone Encounter (Signed)
OK to place referral? Please advise 

## 2020-07-11 NOTE — Telephone Encounter (Signed)
Ok for referral to ENT. 

## 2020-07-11 NOTE — Telephone Encounter (Signed)
Patient called in and stated she went to UC today she thinks she might have broke her nose they couldn't do a xray of it. Patient was told by UC that she should get a referral to a ENT or a Orthopedic. Please advise.

## 2020-07-11 NOTE — ED Provider Notes (Signed)
[facility]    CSN: [id number] Arrival date & time: 07/11/20  3016      History   Chief Complaint Chief Complaint  Patient presents with  . Fall    HPI Alison Monroe is a 59 y.o. female.   59 year old female with extensive surgical history to the thoracic/lumbar spine, DM, atrial thrombus on eliquis, osteoporosis  comes in for right shoulder, hip, knee pain after fall yesterday. States baseline numbness to the right lower extremity, and leg gave out, causing her to fall. Hit face, right side of the body. Denies loss of consciousness. Was at the ED with negative head CT, but left prior to being seen. Sates has pain to the right shoulder, hip, knee. Able to ambulate on own with walker, which is her baseline. Denies changes in baseline numbness/tingling.      Past Medical History:  Diagnosis Date  . Arthritis   . Asthma   . Atrial thrombus 05/26/2020   Resolved by echo 2021; lifelong anticoagulation  . Chronic kidney disease    Stage 3  . Diabetes (HCC)   . Heart murmur     Patient Active Problem List   Diagnosis Date Noted  . Atrial thrombus 05/26/2020  . Type 2 diabetes mellitus with stage 3a chronic kidney disease, without long-term current use of insulin (HCC) 05/24/2020  . Osteoporosis 04/17/2020  . Fusion of spine, thoracolumbar region 12/31/2019  . Multiple drug allergies 12/27/2019  . Chronic rhinitis 12/21/2019  . Trigger finger of left hand 06/21/2019  . Localized, primary osteoarthritis of hand 05/28/2019  . Loosening of hardware in spine (HCC) 01/26/2019  . Degeneration of lumbar intervertebral disc 12/17/2018  . Fibrocystic breast changes 12/17/2018  . Gastro-esophageal reflux disease without esophagitis 12/17/2018  . Infection of intervertebral disc (pyogenic), lumbar region (HCC) 12/17/2018  . Knee osteoarthritis 12/17/2018  . MRSA (methicillin resistant Staphylococcus aureus) colonization 12/17/2018  . Narcotic dependence (HCC)  12/17/2018  . 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 S/P bariatric surgery 12/17/2018  . Chronic kidney disease, stage 3 unspecified 12/17/2018  . Chronic bilateral low back pain with right-sided sciatica 12/17/2018  . Chronic pain syndrome 12/17/2018  . Vitamin B 12 deficiency 12/17/2018  . Lumbar nerve root impingement 12/01/2018  . Lumbar foraminal stenosis 12/01/2018  . Spondylolisthesis of lumbar region 12/01/2018  . S/P lumbar fusion 09/11/2016  . Infection of lumbar spine (HCC) 06/04/2016  . Leukocytosis 05/05/2016  . 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 ANKLE SURGERY Left   . BREAST BIOPSY    . CATARACT EXTRACTION  2015  . GALLBLADDER SURGERY  1981  . GASTRIC BYPASS    . LUMBAR SPINE SURGERY     Several. Rods, bolts and screws in place  . TOTAL KNEE ARTHROPLASTY Right 2015    OB History   No obstetric history on file.      Home Medications    Prior to Admission medications   Medication Sig Start Date End Date Taking? Authorizing Provider  acetaminophen (TYLENOL) 500 MG tablet Take 1,000 mg by mouth 2 (two) times daily as needed.    [provider]  apixaban (ELIQUIS) 5 MG TABS tablet Take 1 tablet (5 mg total) by mouth 2 (two) times daily. 05/25/20   Hilty, Lisette Abu, MD  baclofen (LIORESAL) 20 MG tablet Take 20 mg by mouth 3 (three) times daily.  11/29/19   [provider]  Calcium  Carb-Cholecalciferol (CALCIUM + D3 PO) Take 1 tablet by mouth in the morning, at noon, in the evening, and at bedtime.    [provider]  Cholecalciferol (VITAMIN D3 PO) Take by mouth in the morning, at noon, and at bedtime.    [provider]  cyanocobalamin (,VITAMIN B-12,) 1000 MCG/ML injection Inject 1 mL into the skin every 30 (thirty) days.     [provider]  diclofenac Sodium (VOLTAREN) 1 % GEL Apply 2 g topically in the morning and at bedtime.      [provider]  diphenhydrAMINE (BENADRYL) 50 MG capsule Take 50 mg by mouth as needed for allergies or sleep.     [provider]  docusate sodium (COLACE) 100 MG capsule Take 100 mg by mouth 2 (two) times daily.    [provider]  escitalopram (LEXAPRO) 10 MG tablet Take 10 mg by mouth daily. 10/07/19   [provider]  fentaNYL (DURAGESIC - DOSED MCG/HR) 100 MCG/HR Place 1 patch onto the skin every other day. 11/14/17   [provider]  HYDROmorphone (DILAUDID) 8 MG tablet Take 1 tablet by mouth every 6 (six) hours.     [provider]  Multiple Vitamin (MULTIVITAMIN) tablet Take 1 tablet by mouth daily.    [provider]  omeprazole (PRILOSEC) 20 MG capsule Take 1 capsule by mouth in the morning, at noon, and at bedtime.     [provider]  torsemide (DEMADEX) 20 MG tablet Take 0.5 tablets (10 mg total) by mouth daily. 03/24/20   Willow Ora, MD    Family History Family History  Problem Relation Age of Onset  . Arthritis Mother   . Asthma Mother   . COPD Mother   . Depression Mother   . Diabetes Mother   . Early death Mother   . Heart disease Mother   . Hyperlipidemia Mother   . Hypertension Mother   . Heart attack Mother   . COPD Father   . Diabetes Father   . Early death Father   . Heart attack Father   . Kidney disease Father   . Hypertension Father   . Stroke Father   . Diabetes Brother   . Heart disease Brother   . Hyperlipidemia Brother   . Hypertension Brother   . Arthritis Maternal Grandmother   . Asthma Maternal Grandmother   . Diabetes Maternal Grandmother   . Cancer Maternal Grandfather   . Osteoporosis Neg Hx     Social History Social History   Tobacco Use  . Smoking status: Never Smoker  . Smokeless tobacco: Never Used  Vaping Use  . Vaping Use: Never used  Substance Use Topics  . Alcohol use: Never  . Drug use: Never     Allergies   Erythromycin, Erythromycin base,  Metronidazole, Other, Penicillins, Vancomycin, Adhesive  [tape], and Ampicillin   Review of Systems Review of Systems  Reason unable to perform ROS: See HPI as above.     Physical Exam Triage Vital Signs ED Triage Vitals  Enc Vitals Group     BP 07/11/20 0939 (!) 168/78     Pulse Rate 07/11/20 0939 84     Resp 07/11/20 0939 18     Temp 07/11/20 0939 98.2 F (36.8 C)     Temp Source 07/11/20 0939 Oral     SpO2 07/11/20 0939 95 %     Weight --      Height --  Head Circumference --      Peak Flow --      Pain Score 07/11/20 0948 8     Pain Loc --      Pain Edu? --      Excl. in GC? --    No data found.  Updated Vital Signs BP (!) 168/78 (BP Location: Left Arm)   Pulse 84   Temp 98.2 F (36.8 C) (Oral)   Resp 18   SpO2 95%   Physical Exam Constitutional:      General: She is not in acute distress.    Appearance: Normal appearance. She is well-developed. She is not toxic-appearing or diaphoretic.  HENT:     Head: Normocephalic and atraumatic.     Nose:     Comments: Mild erythema to the mid nose bridge. No contusion seen. Nose slightly deviated to right, patient states this is not baseline. No septal hematoma, epistaxis, occlusion on exam Eyes:     Extraocular Movements: Extraocular movements intact.     Conjunctiva/sclera: Conjunctivae normal.     Pupils: Pupils are equal, round, and reactive to light.  Cardiovascular:     Rate and Rhythm: Normal rate and regular rhythm.  Pulmonary:     Effort: Pulmonary effort is normal. No respiratory distress.     Comments: LCTAB Musculoskeletal:     Cervical back: Normal range of motion and neck supple.     Comments: Contusion to the anterior right shoulder, bilateral knees. No significant swelling to the joints. No tenderness to palpation of spinous processes. No tenderness to palpation of the clavicle. Tenderness to the proximal humerus. Decreased abduction of shoulder. Full ROM of elbow. Strength 5/5 to the elbow,  fingers. Sensation intact, radial pulse 2+  Tenderness to palpation along right hip diffusely. Stable pelvis. Full ROM of the hip. Negative straight leg raise  Skin:    General: Skin is warm and dry.  Neurological:     Mental Status: She is alert and oriented to person, place, and time.      UC Treatments / Results  Labs (all labs ordered are listed, but only abnormal results are displayed) Labs Reviewed - No data to display  EKG   Radiology DG Pelvis 1-2 Views  Result Date: 07/11/2020 CLINICAL DATA:  Pain following fall EXAM: PELVIS - 1-2 VIEW COMPARISON:  None. FINDINGS: There is no evidence of pelvic fracture or dislocation. Hip joints appear symmetric and normal bilaterally. There is a degree of osteoarthritic change in the right sacroiliac joint. Postoperative changes are noted in the visualized lower lumbar region. IMPRESSION: No fracture or dislocation. There is a degree of osteoarthritic change in the right sacroiliac joint. Left sacroiliac joint and both hip joints appear unremarkable. Postoperative change lower lumbar spine. Electronically Signed   By: Bretta Bang III M.D.   On: 07/11/2020 10:47   DG Shoulder Right  Result Date: 07/11/2020 CLINICAL DATA:  Pain following fall EXAM: RIGHT SHOULDER - 2+ VIEW COMPARISON:  None. FINDINGS: Oblique, Y scapular, and axillary images were obtained. No fracture or dislocation. No appreciable joint space narrowing. Slight calcification in the acromioclavicular joint region is likely of arthropathic etiology. No erosion. Visualized right lung clear. IMPRESSION: No fracture or dislocation. No appreciable joint space narrowing. Probable mild arthropathic calcification in the acromioclavicular joint region. Electronically Signed   By: Bretta Bang III M.D.   On: 07/11/2020 10:45   CT Head Wo Contrast  Result Date: 07/10/2020 CLINICAL DATA:  59 year old female with trauma. EXAM: CT  HEAD WITHOUT CONTRAST TECHNIQUE: Contiguous axial  images were obtained from the base of the skull through the vertex without intravenous contrast. COMPARISON:  None. FINDINGS: Brain: The ventricles and sulci appropriate size for patient's age. The gray-white matter discrimination is preserved. There is no acute intracranial hemorrhage. No mass effect or midline shift. No extra-axial fluid collection. Vascular: No hyperdense vessel or unexpected calcification. Skull: Normal. Negative for fracture or focal lesion. Sinuses/Orbits: Mild mucoperiosteal thickening of paranasal sinuses. No air-fluid level. The mastoid air cells are clear. Other: None IMPRESSION: Unremarkable noncontrast CT of the brain. Electronically Signed   By: Elgie CollardArash  Radparvar M.D.   On: 07/10/2020 22:49   DG Knee Complete 4 Views Right  Result Date: 07/11/2020 CLINICAL DATA:  Pain following fall EXAM: RIGHT KNEE - COMPLETE 4+ VIEW COMPARISON:  None. FINDINGS: Frontal, bilateral oblique, and lateral views were obtained. Patient is status post total knee replacement with femoral and tibial prosthetic components well seated. No fracture or dislocation. There is a small joint effusion. No erosive change. No bony destruction. Bones appear somewhat osteoporotic. There are foci of arterial vascular calcification at multiple sites. IMPRESSION: Status post total knee replacement with prosthetic components well-seated. No fracture or dislocation. Small joint effusion present. Bones appear osteoporotic. Multiple foci of arterial vascular calcification noted. Electronically Signed   By: Bretta BangWilliam  Woodruff III M.D.   On: 07/11/2020 10:46    Procedures Procedures (including critical care time)  Medications Ordered in UC Medications - No data to display  Initial Impression / Assessment and Plan / UC Course  I have reviewed the triage vital signs and the nursing notes.  Pertinent labs & imaging results that were available during my care of the patient were reviewed by me and considered in my medical  decision making (see chart for details).    Given history of osteoporosis, xrays obtained, negative for fracture/dislocation. Nose injury with slight deviation, nostrils not occluded, will have patient follow up with ENT for further evaluation. Patient already on chronic pain meds, to continue for symptomatic management. Patient to follow up with orthopedics for further evaluation if symptoms not improving. Return precautions given.   Final Clinical Impressions(s) / UC Diagnoses   Final diagnoses:  Acute pain of right shoulder  Right hip pain  Acute pain of right knee  Fall, initial encounter    ED Prescriptions    None     PDMP not reviewed this encounter.   Belinda FisherYu, Katilyn Miltenberger V, PA-C 07/11/20 1103

## 2020-07-11 NOTE — Telephone Encounter (Signed)
Referral placed.

## 2020-07-11 NOTE — ED Notes (Signed)
Pt not responding to vitals recheck  

## 2020-07-11 NOTE — Discharge Instructions (Addendum)
Xray negative for fracture or dislocation to the shoulder, hip/pelvis, knee. Follow up with orthopedics for further evaluation if symptoms not improving. Follow up with ENT for nose pain and possible fracture.   If experiencing loss of grip strength, numbness to the arm, go to the emergency department for further evaluation.   If experience numbness/tingling of the inner thighs, loss of bladder or bowel control, go to the emergency department for evaluation.   If experiencing worsening of symptoms, headache/blurry vision, nausea/vomiting, confusion/altered mental status, dizziness, weakness, passing out, imbalance, go to the emergency department for further evaluation.

## 2020-07-11 NOTE — ED Notes (Signed)
Found pt labels lying on counter and pt not inside or outside of lobby

## 2020-07-21 ENCOUNTER — Telehealth: Payer: Self-pay

## 2020-07-21 ENCOUNTER — Other Ambulatory Visit: Payer: Self-pay | Admitting: Neurosurgery

## 2020-07-21 ENCOUNTER — Other Ambulatory Visit: Payer: Self-pay

## 2020-07-21 ENCOUNTER — Telehealth: Payer: Self-pay | Admitting: Endocrinology

## 2020-07-21 ENCOUNTER — Other Ambulatory Visit: Payer: Self-pay | Admitting: Endocrinology

## 2020-07-21 DIAGNOSIS — M810 Age-related osteoporosis without current pathological fracture: Secondary | ICD-10-CM

## 2020-07-21 DIAGNOSIS — M4807 Spinal stenosis, lumbosacral region: Secondary | ICD-10-CM

## 2020-07-21 DIAGNOSIS — M546 Pain in thoracic spine: Secondary | ICD-10-CM

## 2020-07-21 NOTE — Telephone Encounter (Signed)
Please advise, I am not sure why Dr. Kendra Opitz is not ordering MRI.... Patient reported this to our office

## 2020-07-21 NOTE — Telephone Encounter (Signed)
Pt has sent a My Chart message which has been forwarded to Dr. Everardo All for him to review and to address with pt directly.

## 2020-07-21 NOTE — Telephone Encounter (Signed)
Ok with me 

## 2020-07-21 NOTE — Telephone Encounter (Signed)
Pt called in and said Dr. Kendra Opitz sent her orders. She does not need Dr. Modesta Messing help anymore, but said thank you!

## 2020-07-21 NOTE — Telephone Encounter (Signed)
Patient called asking if we could fax the orders for her labs to [facility] in [place] on [address].   [address] [place], [place] [address]   Fax# [facility]) - [phone]

## 2020-07-21 NOTE — Telephone Encounter (Signed)
Pt is requesting to transfer care to Dr. Artis Flock, due to her family also being patients of Dr. Artis Flock. May I transfer her care from Dr. Mardelle Matte to Dr. Artis Flock?

## 2020-07-21 NOTE — Telephone Encounter (Signed)
Dr Kendra Opitz at [facility] is requesting the pt have a MRI of the thorasic and lumbar spine according to the patient. She would like to have an MRI ordered anywhere in this area, no specific location mentioned.

## 2020-07-24 NOTE — Telephone Encounter (Signed)
Pt is scheduled for a TOC.  

## 2020-07-24 NOTE — Telephone Encounter (Signed)
Sure,  Dr. Elky Funches  

## 2020-07-28 ENCOUNTER — Telehealth: Payer: Self-pay | Admitting: Family Medicine

## 2020-07-28 NOTE — Telephone Encounter (Signed)
Verbal orders given to Pete 

## 2020-07-28 NOTE — Telephone Encounter (Signed)
Pete from [facility] called to get orders to keep going with PT please advise. Best phone number is [phone].

## 2020-07-28 NOTE — Telephone Encounter (Signed)
yes

## 2020-07-28 NOTE — Telephone Encounter (Signed)
OK to continue PT? Please advise

## 2020-08-15 ENCOUNTER — Other Ambulatory Visit: Payer: Medicare Other

## 2020-08-16 ENCOUNTER — Encounter: Payer: Self-pay | Admitting: Family Medicine

## 2020-08-16 ENCOUNTER — Other Ambulatory Visit: Payer: Self-pay

## 2020-08-16 ENCOUNTER — Ambulatory Visit (INDEPENDENT_AMBULATORY_CARE_PROVIDER_SITE_OTHER): Payer: Medicare Other | Admitting: Family Medicine

## 2020-08-16 VITALS — BP 178/100 | HR 68 | Temp 98.4°F | Ht 64.0 in | Wt 214.2 lb

## 2020-08-16 DIAGNOSIS — E1121 Type 2 diabetes mellitus with diabetic nephropathy: Secondary | ICD-10-CM

## 2020-08-16 DIAGNOSIS — E559 Vitamin D deficiency, unspecified: Secondary | ICD-10-CM | POA: Insufficient documentation

## 2020-08-16 DIAGNOSIS — M5136 Other intervertebral disc degeneration, lumbar region: Secondary | ICD-10-CM | POA: Diagnosis not present

## 2020-08-16 DIAGNOSIS — N1831 Chronic kidney disease, stage 3a: Secondary | ICD-10-CM

## 2020-08-16 DIAGNOSIS — N183 Chronic kidney disease, stage 3 unspecified: Secondary | ICD-10-CM

## 2020-08-16 DIAGNOSIS — F1120 Opioid dependence, uncomplicated: Secondary | ICD-10-CM | POA: Diagnosis not present

## 2020-08-16 DIAGNOSIS — N3000 Acute cystitis without hematuria: Secondary | ICD-10-CM

## 2020-08-16 DIAGNOSIS — R030 Elevated blood-pressure reading, without diagnosis of hypertension: Secondary | ICD-10-CM | POA: Diagnosis not present

## 2020-08-16 DIAGNOSIS — E1122 Type 2 diabetes mellitus with diabetic chronic kidney disease: Secondary | ICD-10-CM

## 2020-08-16 MED ORDER — CIPROFLOXACIN HCL 250 MG PO TABS
250.0000 mg | ORAL_TABLET | Freq: Two times a day (BID) | ORAL | 0 refills | Status: DC
Start: 2020-08-16 — End: 2020-08-28

## 2020-08-16 MED ORDER — CYCLOBENZAPRINE HCL 10 MG PO TABS
10.0000 mg | ORAL_TABLET | Freq: Three times a day (TID) | ORAL | 1 refills | Status: AC | PRN
Start: 2020-08-16 — End: ?

## 2020-08-16 NOTE — Progress Notes (Signed)
Patient: Alison Monroe MRN: [id number] DOB: 1960/12/22 PCP: Orland Mustard, MD     Subjective:  Chief Complaint  Patient presents with  . Transitions Of Care  . Diabetes  . Chronic Kidney Disease  . Urinary Tract Infection    HPI: The patient is a 59 y.o. female who presents today for Transition of care. She was seen by her PCP in [place] recently with labs. She can't get the labs off her phone.   Complex medical history with narcotic dependence and back issues. Followed by a pain doctor in [place], but would like on here. Requesting a referral for this. Her back doctor Dr. Park Breed and she has upcoming surgery on her back in February. She just had some MRIs done.   Type 2 diabetes She is diet controlled. Diagnosed in 1989. Got very good control after her weight loss. Last a1c was 6.5 in [place]. I do not have these labs.   GERD On prilosec. Very well controlled.   CKD Secondary to vancomycin. Baseline creatinine appears to be around 1.6-1.7. she is on baclofen which is not good for chronic kidney disease.   She is followed by pain management and endo for her osteoarthritis/vitamin D deficiency.   She also had a urine done with culture. Her ua looks fine, but her culture grew 25,000-50,000 colonies of proteus mirabilis. She does have frequency and urgency   Review of Systems  Constitutional: Positive for diaphoresis and fatigue. Negative for chills and fever.  Respiratory: Negative for cough and shortness of breath.   Cardiovascular: Negative for chest pain and palpitations.  Gastrointestinal: Negative for abdominal pain, blood in stool, diarrhea, nausea and vomiting.  Genitourinary: Negative for dyspareunia and vaginal bleeding.  Musculoskeletal: Positive for back pain.    Allergies Patient is allergic to erythromycin, erythromycin base, metronidazole, other, penicillins, vancomycin, adhesive  [tape], and ampicillin.  Past Medical History Patient  has a past medical  history of Arthritis, Asthma, Atrial thrombus (05/26/2020), Chronic kidney disease, Diabetes (HCC), and Heart murmur.  Surgical History Patient  has a past surgical history that includes Gallbladder surgery (1981); Breast biopsy; Lumbar spine surgery; Gastric bypass; Ankle surgery (Left); Cataract extraction (2015); and Total knee arthroplasty (Right, 2015).  Family History Pateint's family history includes Arthritis in her maternal grandmother and mother; Asthma in her maternal grandmother and mother; COPD in her father and mother; Cancer in her maternal grandfather; Depression in her mother; Diabetes in her brother, father, maternal grandmother, and mother; Early death in her father and mother; Heart attack in her father and mother; Heart disease in her brother and mother; Hyperlipidemia in her brother and mother; Hypertension in her brother, father, and mother; Kidney disease in her father; Stroke in her father.  Social History Patient  reports that she has never smoked. She has never used smokeless tobacco. She reports that she does not drink alcohol and does not use drugs.    Objective: Vitals:   08/16/20 1338 08/16/20 1418  BP: (!) 171/88 (!) 178/100  Pulse: 68   Temp: 98.4 F (36.9 C)   TempSrc: Temporal   SpO2: 96%   Weight: 214 lb 3.2 oz (97.2 kg)   Height: 5\' 4"  (1.626 m)     Body mass index is 36.77 kg/m.  Physical Exam Vitals reviewed.  Constitutional:      Appearance: Normal appearance. She is obese.     Comments: In back brace   HENT:     Head: Normocephalic and atraumatic.  Cardiovascular:  Rate and Rhythm: Normal rate and regular rhythm.     Heart sounds: Normal heart sounds.  Pulmonary:     Effort: Pulmonary effort is normal.     Breath sounds: Normal breath sounds.  Abdominal:     General: Bowel sounds are normal. There is no distension.     Palpations: Abdomen is soft.  Neurological:     General: No focal deficit present.     Mental Status: She is  alert.  Psychiatric:        Mood and Affect: Mood normal.        Behavior: Behavior normal.   urine culture and labs reviewed from phone.      Assessment/plan: 1. Narcotic dependence (HCC)  - Ambulatory referral to Pain Clinic  2. Degeneration of lumbar intervertebral disc  - Ambulatory referral to Pain Clinic   4. Elevated blood pressure reading Elevated x2. Has been to goal in past and she tells me normal at home when nurses come. Will have her continue to watch at home and f/u with me in 3 months time.   5. Type 2 diabetes mellitus with stage 3a chronic kidney disease, without long-term current use of insulin (HCC) a1c to goal at 6.5. continue diet controlled/exercise etc. F/u in 6 months.   6. Stage 3 chronic kidney disease, unspecified whether stage 3a or 3b CKD Stopping baclofen as contraindicated in renal disease. Changing her over to flexeril. Has tolerated in past fine. Discussed may make her drowsy.   7. Acute cystitis without hematuria Proteus mirabilis. Renally dosed cipro. Let me know if symptoms do not resolve.   -requesting labs from [facility] that were just done.   This visit occurred during the SARS-CoV-2 public health emergency.  Safety protocols were in place, including screening questions prior to the visit, additional usage of staff PPE, and extensive cleaning of exam room while observing appropriate contact time as indicated for disinfecting solutions.     Return in about 3 months (around 11/15/2020) for blood pressure/labs. Orland Mustard, MD [facility]   08/16/2020

## 2020-08-16 NOTE — Patient Instructions (Addendum)
-  stopping baclofen, bad for kidneys and sending in flexeril instead. Just caution drowsiness.   -will treat your urine infection with cipro renally adjusted.   -referral for pain done again.    -blood pressure is really high for me. So make sure it comes back down at home. If not, let me know because we will need to start medication.   So good to meet you! Dr. Artis Flock

## 2020-08-24 ENCOUNTER — Telehealth: Payer: Medicare Other | Admitting: Family Medicine

## 2020-08-25 LAB — COMPREHENSIVE METABOLIC PANEL
Albumin: 4 (ref 3.5–5.0)
GFR calc Af Amer: 48
GFR calc non Af Amer: 42
Globulin: 3.5

## 2020-08-25 LAB — CBC: RBC: 4.72 (ref 3.87–5.11)

## 2020-08-25 LAB — HEPATIC FUNCTION PANEL
ALT: 20 (ref 7–35)
AST: 28 (ref 13–35)
Alkaline Phosphatase: 88 (ref 25–125)
Bilirubin, Total: 0.3

## 2020-08-25 LAB — CBC AND DIFFERENTIAL
HCT: 37 (ref 36–46)
Hemoglobin: 11.5 — AB (ref 12.0–16.0)
Neutrophils Absolute: 3.9
Platelets: 268 (ref 150–399)
WBC: 5.8

## 2020-08-25 LAB — BASIC METABOLIC PANEL
BUN: 22 — AB (ref 4–21)
CO2: 21 (ref 13–22)
Chloride: 100 (ref 99–108)
Creatinine: 1.4 — AB (ref 0.5–1.1)
Glucose: 115
Potassium: 5.7 — AB (ref 3.4–5.3)
Sodium: 135 — AB (ref 137–147)

## 2020-08-25 LAB — VITAMIN B12: Vitamin B-12: 1008

## 2020-08-25 LAB — VITAMIN D 25 HYDROXY (VIT D DEFICIENCY, FRACTURES): Vit D, 25-Hydroxy: 24.7

## 2020-08-25 LAB — TSH: TSH: 1.65 (ref 0.41–5.90)

## 2020-08-28 ENCOUNTER — Other Ambulatory Visit: Payer: Self-pay

## 2020-08-28 ENCOUNTER — Ambulatory Visit: Payer: Medicare Other | Admitting: Endocrinology

## 2020-08-28 ENCOUNTER — Encounter: Payer: Self-pay | Admitting: Endocrinology

## 2020-08-28 VITALS — BP 138/80 | HR 92 | Wt 211.0 lb

## 2020-08-28 DIAGNOSIS — M810 Age-related osteoporosis without current pathological fracture: Secondary | ICD-10-CM

## 2020-08-28 MED ORDER — CHOLECALCIFEROL 25 MCG (1000 UT) PO CAPS: 6000.0000 [IU] | ORAL_CAPSULE | Freq: Every day | ORAL | 11 refills | Status: AC

## 2020-08-28 NOTE — Patient Instructions (Addendum)
Please increase the vitamin-D to 6000 units/day.   Please come back for a follow-up appointment in 1 month.
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
## 2020-09-12 ENCOUNTER — Telehealth: Payer: Self-pay

## 2020-09-12 DIAGNOSIS — G894 Chronic pain syndrome: Secondary | ICD-10-CM

## 2020-09-12 NOTE — Telephone Encounter (Signed)
Pt called stating [facility] would not see pt. Can you please put in another referral for pain management? I can send to another practice.   Thanks!

## 2020-09-13 NOTE — Telephone Encounter (Signed)
New referral placed.  Orland Mustard, MD [facility]

## 2020-09-15 ENCOUNTER — Telehealth: Payer: Self-pay

## 2020-09-15 NOTE — Telephone Encounter (Signed)
Alison Monroe, physical therapist, from [facility] wanted to let Dr. Artis Flock know that they were not able to go out to the pt today and they will go next week.

## 2020-09-26 ENCOUNTER — Other Ambulatory Visit: Payer: Self-pay

## 2020-09-26 ENCOUNTER — Telehealth: Payer: Self-pay

## 2020-09-26 DIAGNOSIS — R32 Unspecified urinary incontinence: Secondary | ICD-10-CM

## 2020-09-26 DIAGNOSIS — G894 Chronic pain syndrome: Secondary | ICD-10-CM

## 2020-09-26 NOTE — Telephone Encounter (Signed)
Patient is requesting a referral to a urologist due to urinary incontinence, the inability to control bladder completely.   Patient is also requesting another pain management provider patient states the first one we recommended told her they would not see her and patient would like new reccomendation

## 2020-09-26 NOTE — Telephone Encounter (Signed)
Both referral have been placed.

## 2020-10-03 ENCOUNTER — Other Ambulatory Visit: Payer: Self-pay

## 2020-10-03 ENCOUNTER — Ambulatory Visit: Payer: Medicare Other | Admitting: Endocrinology

## 2020-10-03 ENCOUNTER — Encounter: Payer: Self-pay | Admitting: Endocrinology

## 2020-10-03 VITALS — BP 168/88 | HR 85 | Ht 64.0 in | Wt 211.0 lb

## 2020-10-03 DIAGNOSIS — M810 Age-related osteoporosis without current pathological fracture: Secondary | ICD-10-CM

## 2020-10-03 LAB — VITAMIN D 25 HYDROXY (VIT D DEFICIENCY, FRACTURES): VITD: 41.77 ng/mL (ref 30.00–100.00)
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
## 2020-10-03 NOTE — Patient Instructions (Addendum)
Your blood pressure is high today.  Please see your primary care provider soon, to have it rechecked.   Blood tests are requested for you today.  We'll let you know about the results.   Please come back for a follow-up appointment in 1 month.   

## 2020-10-04 LAB — PTH, INTACT AND CALCIUM
Calcium: 8.6 mg/dL (ref 8.6–10.4)
PTH: 63 pg/mL (ref 14–64)

## 2020-10-05 ENCOUNTER — Encounter: Payer: Self-pay | Admitting: Endocrinology

## 2020-10-06 ENCOUNTER — Other Ambulatory Visit: Payer: Self-pay | Admitting: Endocrinology

## 2020-10-06 MED ORDER — TERIPARATIDE 620 MCG/2.48ML ~~LOC~~ SOPN: 20.0000 ug | PEN_INJECTOR | Freq: Every day | SUBCUTANEOUS | 11 refills | Status: DC

## 2020-10-09 NOTE — Progress Notes (Signed)
Key: [id number] - PA Case ID: [id number] - Rx #: [id number] Need help? Call us at [phone] Status Sent to [employer] Forteo 620MCG/2.48ML pen-injectors Form [employer] Medicare Part D Electronic Prior Authorization Form (2017 NCPDP) Original Claim Info [phone] Provide Exception Process Printed NoticeDr. Submit ePA at [email or web address] Requires Prior Authorization

## 2020-10-10 ENCOUNTER — Telehealth: Payer: Self-pay

## 2020-10-10 DIAGNOSIS — S21209A Unspecified open wound of unspecified back wall of thorax without penetration into thoracic cavity, initial encounter: Secondary | ICD-10-CM

## 2020-10-10 NOTE — Telephone Encounter (Signed)
Erica patients nurse wanted to inform dr.wolfe of patients chronic wound is getting slightly bigger 3.2x2.5x.1.5. Patient is also expiercing a productive cough with green and yellowish spetum. Also patients BP was 128-74 and stated that dr.wolfe has spoken about BP before with patient.  Nurse states Bp has been up and down lately

## 2020-10-11 NOTE — Telephone Encounter (Signed)
This referral was placed in [facility] on 09/27/20 and is still in review-I did contact the pt and leave her a VM requesting she call to schedule they have everything they need

## 2020-10-11 NOTE — Telephone Encounter (Signed)
Alison Monroe,  I put a urology referral in on 10/12 and patient has not heard. Can you check on this and let patient know please?  Thanks so much! Dr. Artis Flock

## 2020-10-11 NOTE — Telephone Encounter (Signed)
Called and spoke to Alison Monroe. I informed her of the referral to Wound Care. She is agreeable to the referral and states that she does not want to see Dr. Quentin Cornwall for Wound Care. She does not have a blood pressure kit at home and states that the nurse comes out once a week to check on her. She states that she still has a cold and has a covid test scheduled for 3pm. Alison Monroe asks about the referral to urology and if that was placed. She states she has mentioned it during her last appointment.

## 2020-10-11 NOTE — Telephone Encounter (Signed)
Let them know blood pressure is good on that read, if they can keep a log for me and send that would be great.   I have put in a referral for wound care. I would rather get on top of this then let it get worse.   Thanks,  [facility]

## 2020-10-17 ENCOUNTER — Telehealth: Payer: Self-pay

## 2020-10-17 NOTE — Telephone Encounter (Signed)
[facility] called to let Dr. Artis Flock know that pt had a fall last week. There were no injuries.

## 2020-10-17 NOTE — Telephone Encounter (Signed)
FYI

## 2020-10-18 NOTE — Telephone Encounter (Signed)
Noted.  Dontel Harshberger, MD [facility]   

## 2020-10-19 ENCOUNTER — Ambulatory Visit: Payer: Medicare Other | Admitting: Endocrinology

## 2020-10-19 ENCOUNTER — Telehealth: Payer: Self-pay | Admitting: Endocrinology

## 2020-10-19 NOTE — Telephone Encounter (Signed)
Paper work resent.

## 2020-10-19 NOTE — Telephone Encounter (Signed)
Patient called re: Patient states she faxed Dr. Odis Luster Cares Forms for Dr. Everardo All to fill out and send to [facility]. [employer] told patient they received the forms from our office but that only what the patient filled out was on the forms and Dr. Everardo All did not fill out his portion of the forms (patient thinks it was pages 5 & 6 per [employer]) before the forms were faxed to [employer].  Patient requests to be called at ph# [phone] re: the above.

## 2020-10-25 ENCOUNTER — Telehealth: Payer: Self-pay

## 2020-10-25 ENCOUNTER — Telehealth: Payer: Self-pay | Admitting: *Deleted

## 2020-10-25 NOTE — Telephone Encounter (Signed)
Notified [facility] for verification insurance information to go forward with the application.  [phone].

## 2020-10-25 NOTE — Telephone Encounter (Signed)
FYI

## 2020-10-25 NOTE — Telephone Encounter (Signed)
Jillyn Hidden called in from [facility] to report that he was there to help with physical therapy. She did not feel like she had to go to ED as she is still able to walk, pain tolerance is a 6/10 at the moment, and he noticed a bruise on the knee cap.

## 2020-10-26 ENCOUNTER — Telehealth: Payer: Self-pay

## 2020-10-26 NOTE — Telephone Encounter (Signed)
error 

## 2020-10-26 NOTE — Telephone Encounter (Signed)
Pt meets program  Eligibility requirement and enrolled in [facility]  [employer] until the end of the year

## 2020-10-30 ENCOUNTER — Ambulatory Visit (INDEPENDENT_AMBULATORY_CARE_PROVIDER_SITE_OTHER): Payer: Medicare Other | Admitting: Endocrinology

## 2020-10-30 ENCOUNTER — Telehealth: Payer: Self-pay | Admitting: Nutrition

## 2020-10-30 ENCOUNTER — Other Ambulatory Visit: Payer: Self-pay

## 2020-10-30 ENCOUNTER — Encounter: Payer: Self-pay | Admitting: Endocrinology

## 2020-10-30 VITALS — BP 142/80 | HR 62 | Ht 64.0 in | Wt 219.0 lb

## 2020-10-30 DIAGNOSIS — M810 Age-related osteoporosis without current pathological fracture: Secondary | ICD-10-CM | POA: Diagnosis not present
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
## 2020-10-30 NOTE — Telephone Encounter (Signed)
This patient was identified by name and DOB.  She was instructed on how to use the forteo pen.  We discussed the storage of the pen, where to inject, how to attach a needle and where to inject, and side effects.  She reported good understanding of all of the above topics,  and had no final questions.

## 2020-10-30 NOTE — Patient Instructions (Addendum)
Your blood pressure is high today.  Please see your primary care provider soon, to have it rechecked. Please start taking the Forteo shot when you receive it tomorrow.   Take calcium, at least 1200 mg per day. Please come back for a follow-up appointment in 2 months.

## 2020-10-31 ENCOUNTER — Telehealth: Payer: Self-pay

## 2020-10-31 ENCOUNTER — Encounter: Payer: Self-pay | Admitting: Family Medicine

## 2020-10-31 ENCOUNTER — Telehealth: Payer: Self-pay | Admitting: Internal Medicine

## 2020-10-31 NOTE — Telephone Encounter (Signed)
Mailed eliquis assistance application to patient - to be completed/returned to office

## 2020-10-31 NOTE — Telephone Encounter (Signed)
Erika from [facility] called reporting pt fell on 10/24/2020. Pt injured right knee and right side of body. Pt also has a wound care appt on the 24th. There is no improvement to wound on back. Please advise.

## 2020-10-31 NOTE — Telephone Encounter (Signed)
FYI

## 2020-11-01 NOTE — Telephone Encounter (Signed)
If she feels like wound is severe and needs urgent care will advise to go to hospital (fever/severe breakdown, purulent drainage, spreading redness) otherwise continue with wound care appointment on 24th.  Dr. Artis Flock

## 2020-11-02 NOTE — Telephone Encounter (Signed)
I spoke with Alison Monroe from [facility] to give message below. She says that the pt is fine, and does not have to go to the hospital. She says that this is a fairly different case. She says that they are required to document and call the  PCP. She was advised to follow up with appointment on Nov. 24th. She agreed with plan.

## 2020-11-08 ENCOUNTER — Other Ambulatory Visit: Payer: Self-pay

## 2020-11-08 ENCOUNTER — Encounter (HOSPITAL_BASED_OUTPATIENT_CLINIC_OR_DEPARTMENT_OTHER): Payer: Medicare Other | Attending: Physician Assistant | Admitting: Physician Assistant

## 2020-11-08 NOTE — Progress Notes (Signed)
DAZJA, HOUCHIN ([id number]) Visit Report for 11/08/2020 Abuse/Suicide Risk Screen Details Patient Name: Date of Service: Alison Monroe, Alison A. 11/08/2020 10:30 A M Medical Record Number: [id number] Patient Account Number: [id number] Date of Birth/Sex: Treating RN: 10-Oct-1961 (59 y.o. Alison Monroe, Alison Monroe Primary Care Jams Trickett: Orland Mustard Other Clinician: Referring Breuna Loveall: Treating Isaack Preble/Extender: Elvin So in Treatment: 0 Abuse/Suicide Risk Screen Items Answer ABUSE RISK SCREEN: Has anyone close to you tried to hurt or harm you recentlyo No Do you feel uncomfortable with anyone in your familyo No Has anyone forced you do things that you didnt want to doo No Electronic Signature(s) Signed: 11/08/2020 4:29:16 PM By: Shawn Stall Entered By: Shawn Stall on 11/08/2020 11:42:31 -------------------------------------------------------------------------------- Activities of Daily Living Details Patient Name: Date of Service: Alison, [name] 11/08/2020 10:30 A M Medical Record Number: [id number] Patient Account Number: [id number] Date of Birth/Sex: Treating RN: 1961-01-23 (59 y.o. Alison Monroe, Alison Monroe Primary Care Noelie Renfrow: Orland Mustard Other Clinician: Referring Talyssa Gibas: Treating Harley Fitzwater/Extender: Elvin So in Treatment: 0 Activities of Daily Living Items Answer Activities of Daily Living (Please select one for each item) Drive Automobile Need Assistance T Medications ake Completely Able Use T elephone Completely Able Care for Appearance Completely Able Use T oilet Completely Able Bath / Shower Need Assistance Dress Self Completely Able Feed Self Completely Able Walk Need Assistance Get In / Out Bed Completely Able Housework Need Assistance Prepare Meals Need Assistance Handle Money Completely Able Shop for Self Need Assistance Electronic Signature(s) Signed: 11/08/2020 4:29:16 PM By: Shawn Stall Entered By:  Shawn Stall on 11/08/2020 11:43:35 -------------------------------------------------------------------------------- Education Screening Details Patient Name: Date of Service: Alison Monroe, [name] 11/08/2020 10:30 A M Medical Record Number: [id number] Patient Account Number: [id number] Date of Birth/Sex: Treating RN: August 22, 1961 (59 y.o. Alison Monroe, Alison Monroe Primary Care Rahman Ferrall: Orland Mustard Other Clinician: Referring Eidan Muellner: Treating Teon Hudnall/Extender: Elvin So in Treatment: 0 Primary Learner Assessed: Patient Learning Preferences/Education Level/Primary Language Learning Preference: Explanation, Demonstration, Printed Material Highest Education Level: College or Above Preferred Language: English Cognitive Barrier Language Barrier: No Translator Needed: No Memory Deficit: No Emotional Barrier: No Cultural/Religious Beliefs Affecting Medical Care: No Physical Barrier Impaired Vision: No Impaired Hearing: No Decreased Hand dexterity: No Knowledge/Comprehension Knowledge Level: High Comprehension Level: High Ability to understand written instructions: High Ability to understand verbal instructions: High Motivation Anxiety Level: Calm Cooperation: Cooperative Education Importance: Acknowledges Need Interest in Health Problems: Asks Questions Perception: Coherent Willingness to Engage in Self-Management High Activities: Readiness to Engage in Self-Management High Activities: Electronic Signature(s) Signed: 11/08/2020 4:29:16 PM By: Shawn Stall Entered By: Shawn Stall on 11/08/2020 11:44:10 -------------------------------------------------------------------------------- Fall Risk Assessment Details Patient Name: Date of Service: Alison Monroe, Alison A. 11/08/2020 10:30 A M Medical Record Number: [id number] Patient Account Number: [id number] Date of Birth/Sex: Treating RN: [birth year] (59 y.o. Alison Monroe, Millard.Loa Primary Care Kysha Muralles: Orland Mustard Other Clinician: Referring Dominic Rhome: Treating Antoino Westhoff/Extender: Elvin So in Treatment: 0 Fall Risk Assessment Items Have you had 2 or more falls in the last 12 monthso 0 Yes Have you had any fall that resulted in injury in the last 12 monthso 0 Yes FALLS RISK SCREEN History of falling - immediate or within 3 months 25 Yes Secondary diagnosis (Do you have 2 or more medical diagnoseso) 0 No Ambulatory aid None/bed rest/wheelchair/nurse 0 No Crutches/cane/walker 15 Yes Furniture 0 No Intravenous therapy Access/Saline/Heparin Lock 0 No Gait/Transferring Normal/ bed rest/ wheelchair 0 Yes Weak (  short steps with or without shuffle, stooped but able to lift head while walking, may seek 0 No support from furniture) Impaired (short steps with shuffle, may have difficulty arising from chair, head down, impaired 0 No balance) Mental Status Oriented to own ability 0 Yes Electronic Signature(s) Signed: 11/08/2020 4:29:16 PM By: Shawn Stall Entered By: Shawn Stall on 11/08/2020 11:44:35 -------------------------------------------------------------------------------- Foot Assessment Details Patient Name: Date of Service: Alison Monroe, Alison A. 11/08/2020 10:30 A M Medical Record Number: [id number] Patient Account Number: [id number] Date of Birth/Sex: Treating RN: 02-08-1961 (59 y.o. Alison Monroe, Alison Monroe Primary Care Neiman Roots: Orland Mustard Other Clinician: Referring Jadelin Eng: Treating Ivonna Kinnick/Extender: Elvin So in Treatment: 0 Foot Assessment Items Site Locations + = Sensation present, - = Sensation absent, C = Callus, U = Ulcer R = Redness, W = Warmth, M = Maceration, PU = Pre-ulcerative lesion F = Fissure, S = Swelling, D = Dryness Assessment Right: Left: Other Deformity: No No Prior Foot Ulcer: No No Prior Amputation: No No Charcot Joint: No No Ambulatory Status: Ambulatory With Help Assistance Device: Walker  Gait: Steady Notes No BLE wounds. Electronic Signature(s) Signed: 11/08/2020 4:29:16 PM By: Shawn Stall Entered By: Shawn Stall on 11/08/2020 11:45:01 -------------------------------------------------------------------------------- Nutrition Risk Screening Details Patient Name: Date of Service: DENELDA, AKERLEY A. 11/08/2020 10:30 A M Medical Record Number: [id number] Patient Account Number: [id number] Date of Birth/Sex: Treating RN: Sep 03, 1961 (59 y.o. Alison Monroe, Millard.Loa Primary Care Saheed Carrington: Orland Mustard Other Clinician: Referring Yue Flanigan: Treating Hamsa Laurich/Extender: Elvin So in Treatment: 0 Height (in): 64 Weight (lbs): 175 Body Mass Index (BMI): 30 Nutrition Risk Screening Items Score Screening NUTRITION RISK SCREEN: I have an illness or condition that made me change the kind and/or amount of food I eat 2 Yes I eat fewer than two meals per day 0 No I eat few fruits and vegetables, or milk products 0 No I have three or more drinks of beer, liquor or wine almost every day 0 No I have tooth or mouth problems that make it hard for me to eat 0 No I don't always have enough money to buy the food I need 0 No I eat alone most of the time 0 No I take three or more different prescribed or over-the-counter drugs a day 1 Yes Without wanting to, I have lost or gained 10 pounds in the last six months 0 No I am not always physically able to shop, cook and/or feed myself 0 No Nutrition Protocols Good Risk Protocol Moderate Risk Protocol 0 Provide education on nutrition High Risk Proctocol Risk Level: Moderate Risk Score: 3 Electronic Signature(s) Signed: 11/08/2020 4:29:16 PM By: Shawn Stall Entered By: Shawn Stall on 11/08/2020 11:44:45

## 2020-11-08 NOTE — Progress Notes (Signed)
Alison Monroe, Alison Monroe ([id number]) Visit Report for 11/08/2020 Allergy List Details Patient Name: Date of Service: Alison Monroe, Alison A. 11/08/2020 10:30 A M Medical Record Number: [id number] Patient Account Number: [id number] Date of Birth/Sex: Treating RN: September 04, 1961 (59 y.o. Alison Monroe, Alison Monroe Primary Care Zaneta Lightcap: Orland Mustard Other Clinician: Referring Shey Bartmess: Treating Savino Whisenant/Extender: Elvin So in Treatment: 0 Allergies Active Allergies penicillin Reaction: swelling, rash Severity: Moderate ampicillin Reaction: rash Severity: Moderate dextran adhesive tape silicone erythromycin base metronidazole naproxen penicillin G benzathine vancomycin suture Allergy Notes Electronic Signature(s) Signed: 11/08/2020 4:29:16 PM By: Shawn Stall Entered By: Shawn Stall on 11/08/2020 11:47:03 -------------------------------------------------------------------------------- Arrival Information Details Patient Name: Date of Service: Alison Monroe, Alison A. 11/08/2020 10:30 A M Medical Record Number: [id number] Patient Account Number: [id number] Date of Birth/Sex: Treating RN: 06-30-1961 (59 y.o. Alison Monroe, Alison Monroe Primary Care Coumba Kellison: Orland Mustard Other Clinician: Referring Littleton Haub: Treating Murrell Dome/Extender: Elvin So in Treatment: 0 Visit Information Patient Arrived: Cloyde Reams Time: 11:25 Accompanied By: friend Transfer Assistance: None Patient Identification Verified: Yes Secondary Verification Process Completed: Yes Patient Requires Transmission-Based Precautions: No Patient Has Alerts: Yes Patient Alerts: Patient on Blood Thinner History Since Last Visit All ordered tests and consults were completed: Yes Added or deleted any medications: Yes Any new allergies or adverse reactions: No Had a fall or experienced change in activities of daily living that may affect risk of falls: No Signs or symptoms of abuse/neglect  since last visito No Hospitalized since last visit: Yes Implantable device outside of the clinic excluding cellular tissue based products placed in the center since last visit: No Has Dressing in Place as Prescribed: Yes Pain Present Now: Yes Electronic Signature(s) Signed: 11/08/2020 4:29:16 PM By: Shawn Stall Entered By: Shawn Stall on 11/08/2020 11:48:53 -------------------------------------------------------------------------------- Clinic Level of Care Assessment Details Patient Name: Date of Service: Alison Monroe, Alison A. 11/08/2020 10:30 A M Medical Record Number: [id number] Patient Account Number: [id number] Date of Birth/Sex: Treating RN: [birth year] (59 y.o. Tommye Standard Primary Care Ashleigh Arya: Orland Mustard Other Clinician: Referring Destaney Sarkis: Treating Madellyn Denio/Extender: Elvin So in Treatment: 0 Clinic Level of Care Assessment Items TOOL 2 Quantity Score []  - 0 Use when only an EandM is performed on the INITIAL visit ASSESSMENTS - Nursing Assessment / Reassessment X- 1 20 General Physical Exam (combine w/ comprehensive assessment (listed just below) when performed on new pt. evals) X- 1 25 Comprehensive Assessment (HX, ROS, Risk Assessments, Wounds Hx, etc.) ASSESSMENTS - Wound and Skin A ssessment / Reassessment X - Simple Wound Assessment / Reassessment - one wound 1 5 []  - 0 Complex Wound Assessment / Reassessment - multiple wounds []  - 0 Dermatologic / Skin Assessment (not related to wound area) ASSESSMENTS - Ostomy and/or Continence Assessment and Care []  - 0 Incontinence Assessment and Management []  - 0 Ostomy Care Assessment and Management (repouching, etc.) PROCESS - Coordination of Care X - Simple Patient / Family Education for ongoing care 1 15 []  - 0 Complex (extensive) Patient / Family Education for ongoing care X- 1 10 Staff obtains , Records, T Results / Process Orders est X- 1 10 Staff telephones HHA,  Nursing Homes / Clarify orders / etc []  - 0 Routine Transfer to another Facility (non-emergent condition) []  - 0 Routine Hospital Admission (non-emergent condition) X- 1 15 New Admissions / / Ordering NPWT Apligraf, etc. , []  - 0 Emergency Hospital Admission (emergent condition) X- 1 10 Simple Discharge Coordination []  - 0  Complex (extensive) Discharge Coordination PROCESS - Special Needs []  - 0 Pediatric / Minor Patient Management []  - 0 Isolation Patient Management []  - 0 Hearing / Language / Visual special needs []  - 0 Assessment of Community assistance (transportation, D/C planning, etc.) []  - 0 Additional assistance / Altered mentation []  - 0 Support Surface(s) Assessment (bed, cushion, seat, etc.) INTERVENTIONS - Wound Cleansing / Measurement X- 1 5 Wound Imaging (photographs - any number of wounds) []  - 0 Wound Tracing (instead of photographs) X- 1 5 Simple Wound Measurement - one wound []  - 0 Complex Wound Measurement - multiple wounds X- 1 5 Simple Wound Cleansing - one wound []  - 0 Complex Wound Cleansing - multiple wounds INTERVENTIONS - Wound Dressings X - Small Wound Dressing one or multiple wounds 1 10 []  - 0 Medium Wound Dressing one or multiple wounds []  - 0 Large Wound Dressing one or multiple wounds []  - 0 Application of Medications - injection INTERVENTIONS - Miscellaneous []  - 0 External ear exam []  - 0 Specimen Collection (cultures, biopsies, blood, body fluids, etc.) []  - 0 Specimen(s) / Culture(s) sent or taken to Lab for analysis []  - 0 Patient Transfer (multiple staff / / Similar devices) []  - 0 Simple Staple / Suture removal (25 or less) []  - 0 Complex Staple / Suture removal (26 or more) []  - 0 Hypo / Hyperglycemic Management (close monitor of Blood Glucose) []  - 0 Ankle / Brachial Index (ABI) - do not check if billed separately Has the patient been seen at the hospital within the last  three years: Yes Total Score: 135 Level Of Care: New/Established - Level 4 Electronic Signature(s) Signed: 11/08/2020 4:58:27 PM By: RN, BSN Entered By: on 11/08/2020 16:11:26 -------------------------------------------------------------------------------- Lower Extremity Assessment Details Patient Name: Date of Service: , Alison A. 11/08/2020 10:30 A M Medical Record Number: Patient Account Number: Date of Birth/Sex: Treating RN: 1961-06-09 (58 y.o. , Primary Care Javaun Dimperio: Other Clinician: Referring Chantale Leugers: Treating Raley Novicki/Extender: in Treatment: 0 Electronic Signature(s) Signed: 11/08/2020 4:29:16 PM By: Nurse, adult Entered By: on 11/08/2020 11:45:06 -------------------------------------------------------------------------------- Multi-Disciplinary Care Plan Details Patient Name: Date of Service: , Alison A. 11/08/2020 10:30 A M Medical Record Number: 11/10/2020 Patient Account Number: Zenaida Deed Date of Birth/Sex: Treating RN: 26-Jan-1961 (59 y.o. Alison Monroe Primary Care Jeffery Gammell: 11/10/2020 Other Clinician: Referring Gerhardt Gleed: Treating Joushua Dugar/Extender: [id number] in Treatment: 0 Active Inactive Abuse / Safety / Falls / Self Care Management Nursing Diagnoses: Potential for falls Goals: Patient/caregiver will verbalize understanding of skin care regimen Date Initiated: 11/08/2020 Target Resolution Date: 12/06/2020 Goal Status: Active Patient/caregiver will verbalize/demonstrate measures taken to prevent injury and/or falls Date Initiated: 11/08/2020 Target Resolution Date: 12/06/2020 Goal Status: Active Interventions: Assess fall risk on admission and as needed Assess impairment of mobility on admission and as needed per policy Notes: Wound/Skin Impairment Nursing  Diagnoses: Impaired tissue integrity Knowledge deficit related to ulceration/compromised skin integrity Goals: Patient/caregiver will verbalize understanding of skin care regimen Date Initiated: 11/08/2020 Target Resolution Date: 12/06/2020 Goal Status: Active Ulcer/skin breakdown will have a volume reduction of 30% by week 4 Date Initiated: 11/08/2020 Target Resolution Date: 12/06/2020 Goal Status: Active Interventions: Assess patient/caregiver ability to obtain necessary supplies Assess patient/caregiver ability to perform ulcer/skin care regimen upon admission and as needed Assess ulceration(s) every visit Provide education on ulcer and skin care Treatment Activities: Skin  care regimen initiated : 11/08/2020 Topical wound management initiated : 11/08/2020 Notes: Electronic Signature(s) Signed: 11/08/2020 4:58:27 PM By: Zenaida Deed RN, BSN Entered By: Zenaida Deed on 11/08/2020 12:32:59 -------------------------------------------------------------------------------- Pain Assessment Details Patient Name: Date of Service: Alison Monroe, Alison A. 11/08/2020 10:30 A M Medical Record Number: [id number] Patient Account Number: [id number] Date of Birth/Sex: Treating RN: 08-07-1961 (59 y.o. Arta Silence Primary Care Jalayah Gutridge: Orland Mustard Other Clinician: Referring Mardelle Pandolfi: Treating Kawon Willcutt/Extender: Elvin So in Treatment: 0 Active Problems Location of Pain Severity and Description of Pain Patient Has Paino No Site Locations Rate the pain. Current Pain Level: 0 Pain Management and Medication Current Pain Management: Medication: No Cold Application: No Rest: No Massage: No Activity: No T.E.N.S.: No Heat Application: No Leg drop or elevation: No Is the Current Pain Management Adequate: Adequate How does your wound impact your activities of daily livingo Sleep: No Bathing: No Appetite: No Relationship With Others: No Bladder  Continence: No Emotions: No Bowel Continence: No Work: No Toileting: No Drive: No Dressing: No Hobbies: No Electronic Signature(s) Signed: 11/08/2020 4:29:16 PM By: Shawn Stall Entered By: Shawn Stall on 11/08/2020 11:40:32 -------------------------------------------------------------------------------- Patient/Caregiver Education Details Patient Name: Date of Service: Alison Monroe 11/24/2021andnbsp10:30 A M Medical Record Number: [id number] Patient Account Number: [id number] Date of Birth/Gender: Treating RN: October 06, 1961 (59 y.o. Tommye Standard Primary Care Physician: Orland Mustard Other Clinician: Referring Physician: Treating Physician/Extender: Elvin So in Treatment: 0 Education Assessment Education Provided To: Patient Education Topics Provided Infection: Methods: Explain/Verbal Responses: Reinforcements needed, State content correctly Wound/Skin Impairment: Methods: Explain/Verbal Responses: Reinforcements needed, State content correctly Electronic Signature(s) Signed: 11/08/2020 4:58:27 PM By: Zenaida Deed RN, BSN Entered By: Zenaida Deed on 11/08/2020 12:33:17 -------------------------------------------------------------------------------- Wound Assessment Details Patient Name: Date of Service: Alison Monroe, Alison A. 11/08/2020 10:30 A M Medical Record Number: [id number] Patient Account Number: [id number] Date of Birth/Sex: Treating RN: Jul 11, 1961 (59 y.o. Alison Monroe, Millard.Loa Primary Care Kirklin Mcduffee: Orland Mustard Other Clinician: Referring Kiara Keep: Treating Syrina Wake/Extender: Elvin So in Treatment: 0 Wound Status Wound Number: 2 Primary Etiology: Open Surgical Wound Wound Location: Distal Back Wound Status: Open Wounding Event: Surgical Injury Comorbid Cataracts, Anemia, End Stage Renal Disease, History: Neuropathy Date Acquired: 12/31/2019 Weeks Of Treatment: 0 Clustered Wound:  No Wound Measurements Length: (cm) 3.4 Width: (cm) 2.2 Depth: (cm) 1.4 Area: (cm) 5.875 Volume: (cm) 8.225 % Reduction in Area: % Reduction in Volume: Epithelialization: None Tunneling: No Undermining: No Wound Description Classification: Full Thickness Without Exposed Support Structures Wound Margin: Distinct, outline attached Exudate Amount: Medium Exudate Type: Serosanguineous Exudate Color: red, brown Foul Odor After Cleansing: No Slough/Fibrino Yes Wound Bed Granulation Amount: Small (1-33%) Exposed Structure Granulation Quality: Pink Fascia Exposed: No Necrotic Amount: Large (67-100%) Fat Layer (Subcutaneous Tissue) Exposed: Yes Necrotic Quality: Adherent Slough Tendon Exposed: No Muscle Exposed: No Joint Exposed: No Bone Exposed: No Electronic Signature(s) Signed: 11/08/2020 4:29:16 PM By: Shawn Stall Entered By: Shawn Stall on 11/08/2020 11:50:07 -------------------------------------------------------------------------------- Vitals Details Patient Name: Date of Service: Alison Monroe, Kiasha A. 11/08/2020 10:30 A M Medical Record Number: [id number] Patient Account Number: [id number] Date of Birth/Sex: Treating RN: [birth year] (59 y.o. Alison Monroe, Alison Monroe Primary Care Collen Hostler: Orland Mustard Other Clinician: Referring Disney Ruggiero: Treating Wilbern Pennypacker/Extender: Elvin So in Treatment: 0 Vital Signs Time Taken: 11:30 Temperature (F): 98.3 Height (in): 64 Monroe (bpm): 102 Source: Stated Respiratory Rate (breaths/min): 20 Weight (lbs): 195 Blood Pressure (mmHg): 155/93 Source: Stated Reference  Range: 80 - 120 mg / dl Body Mass Index (BMI): 33.5 Electronic Signature(s) Signed: 11/08/2020 4:29:16 PM By: Shawn Stalleaton, Bobbi Entered By: Shawn Stalleaton, Bobbi on 11/08/2020 11:48:07

## 2020-11-08 NOTE — Progress Notes (Signed)
Alison, Monroe ([id number]) Visit Report for 11/08/2020 Chief Complaint Document Details Patient Name: Date of Service: [name], Monroe 11/08/2020 10:30 A M Medical Record Number: [id number] Patient Account Number: [id number] Date of Birth/Sex: Treating RN: 02/10/1961 (59 y.o. Alison Monroe Primary Care Provider: Orland Mustard Other Clinician: Referring Provider: Treating Provider/Extender: Elvin So in Treatment: 0 Information Obtained from: Patient Chief Complaint Patient is here for review of her chronic wound on her lower lumbar spine Electronic Signature(s) Signed: 11/08/2020 12:23:31 PM By: Lenda Kelp PA-C Entered By: Lenda Kelp on 11/08/2020 12:23:30 -------------------------------------------------------------------------------- HPI Details Patient Name: Date of Service: Alison Monroe, Alison A. 11/08/2020 10:30 A M Medical Record Number: [id number] Patient Account Number: [id number] Date of Birth/Sex: Treating RN: [birth year] (59 y.o. Alison Monroe Primary Care Provider: Orland Mustard Other Clinician: Referring Provider: Treating Provider/Extender: Elvin So in Treatment: 0 History of Present Illness HPI Description: ADMISSION 09/15/2018 This is a 59 year old woman who has had chronic problems with low back pain dating back many years. Her original surgery was in 1997 after a fall in 1993. She had a large initial surgery involving fusion from T9 to her sacral spine.. That surgery was complicated by multiple infections and revisions. She had plastic surgery involvement with muscle flaps. She had revision of her surgery in 2014 I believe in [place]. She had IV antibiotics for a prolonged period of time at that time and for 2 years as far as I am following her this actually remained closed only to reopen again in roughly 2016. She has been treated with multiple courses of IV antibiotics and has been on  intermittent oral antibiotics. The patient stated that the wound reclosed sometime in 2017 but then reopened 8 months ago and they will open and close episodically since then. She was seen at [facility] by neurosurgery who apparently recommended hardware removal with plastic surgery and delayed open closure. This was in 07/16/2018. On arrival to [facility] on 9/18 she had increased purulent drainage. This was extensively worked up. She was not a candidate for an MRI secondary to hardware. Her CRP was 21.4 and her sed rate was 111. Plastic surgery was consulted but they felt that the patient would ultimately need hardware removal before they could help. Infectious disease saw the patient who recommended a gallium scan to evaluate for osteomyelitis. Patient underwent a nuclear medicine gallium scan which showed no areas of abnormal gallium and no osteomyelitis. Infectious disease therefore did not think she required antibiotics. I do not see any cultures. There were difficulty arranging home health for her as she is not a [address] resident and did not have a primary physician. The patient actually lives in [place] as her primary residence. The patient states that she is noted intermittent drainage coming out of this. She also states that she is intermittently febrile into the 99+ range which is high for her. She is also had episodic fevers that are short-lived to 103. She is in a lot of pain and is on a aggressive narcotic regimen. 09/29/2018; this is a patient with a draining surgical wound on her lower lumbar spine. Culture of this I did in the clinic was MSSA I gave her some doxycycline. She states this made her nauseated but she is managing to tolerate it. I spoke to her on the phone last week to update her on the results. At the time I thought she was going to infectious disease  on Monday where as apparently it is Thursday i.e. in 2 days time. She is using Aquacel Ag and really she is using  this twice a day. I do not completely understand how much how she is going through is much of the Aquacel as she is given the wound area however I understand with the amount of purulent drainage here the need to change this twice a day. She states she has low-grade fevers for her which is in the high a 98.9 99 range but she has not had any other symptoms she has not been systemically unwell. On top of this she is going back to [place] believe in the [place] area on Saturday. She is going to need to have her primary doctor arrange home care for her when she gets back there. She apparently is coming back to [place] sometime in mid November. I had an extensive discussion with her over the phone about her options with regards to doing nothing versus undergoing the commando multiphase surgical approach that was outlined by her neurosurgeon at [facility]. My general feeling is that she is going to have to go ahead with the surgical approach at some point. She is just not ready to make this decision now 11/06/2018; patient has returned from [place]. She is on Bactrim DS as apparently prescribed by infectious disease down in [place]. She states the goal was here to have this on board indefinitely. The patient also saw Dr. Verdie Drownolmer on 10/01/2018. He recommended her to go back to [facility] for the surgery that was previously discussed to remove her hardware. He did not specifically recommend suppressive antibiotics but was on board with follow-up after surgery. The patient is nowhere near a decision for surgery in her mind she wants to wait until she is "sick". I warned her that sepsis can sometimes make people very sick or can urgently make people live threateningly ill she expressed understanding and states that she is already been in that situation. We also have had calls from her insurance company about organizing ongoing home care. Our case management nurse is already reached out to to home health  nursing agency without success because of staffing issues. The patient has been on Aquacel Ag rope packing. She feels this needs to be changed twice a day which is another issue. She has not been systemically unwell 12/07/2018; patient returns and states she is in [place] for another month and a half. She is looking for supplies specifically Aquacel Ag. She has not been systemically unwell no fever no chills. She states the wounds are roughly the same, including the amount of drainage. She stated at the end that there may still be some more pain to the right side of the wound areas. Readmission: 11/08/2020 upon evaluation today patient appears to be doing somewhat poorly here upon readmission with regard to her back ulcer although not as badly as she was in the past. She does have some necrotic tissue noted in the base of the wound and the home health nurse felt that they did need some direction as far as what would be the best way to go forward right now for the patient. She did have hardware removal January 2021. Following this she was given doxycycline eventually which she is actually still on currently until she goes back for her next surgery. She is also on Dilaudid 8 mg four times a day for pain. She had's home health who has been changing the dressing for her  they have been using a silver alginate dressing. With that being said she is actually tentatively going to be scheduled for hardware placement and a muscle flap closure once she improved significantly regard to bone density she was given Forteo to try to help build up her bone strength. 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 I do not see any overt infection at this point externally upon evaluation of the wound. There is some necrotic tissue at the base of the wound. Electronic  Signature(s) Signed: 11/08/2020 4:50:00 PM By: Lenda Kelp PA-C Entered By: Lenda Kelp on 11/08/2020 16:49:59 -------------------------------------------------------------------------------- Physical Exam Details Patient Name: Date of Service: Alison Monroe, Alison A. 11/08/2020 10:30 A M Medical Record Number: [id number] Patient Account Number: [id number] Date of Birth/Sex: Treating RN: 1961/11/21 (59 y.o. Alison Monroe Primary Care Provider: Orland Mustard Other Clinician: Referring Provider: Treating Provider/Extender: Elvin So in Treatment: 0 Constitutional patient is hypertensive.. pulse regular and within target range for patient.Marland Kitchen respirations regular, non-labored and within target range for patient.Marland Kitchen temperature within target range for patient.. Well-nourished and well-hydrated in no acute distress. Eyes conjunctiva clear no eyelid edema noted. pupils equal round and reactive to light and accommodation. Ears, Nose, Mouth, and Throat no gross abnormality of ear auricles or external auditory canals. normal hearing noted during conversation. mucus membranes moist. Respiratory normal breathing without difficulty. Cardiovascular no clubbing, cyanosis, significant edema, <3 sec cap refill. Musculoskeletal normal gait and posture. no significant deformity or arthritic changes, no loss or range of motion, no clubbing. Psychiatric this patient is able to make decisions and demonstrates good insight into disease process. Alert and Oriented x 3. pleasant and cooperative. Notes Upon inspection patient's wound bed did show some signs of minimal slough noted in the base of the wound. I do think something to help clean this up would be of benefit. I really think that aggressive sharp debridement is probably not in the patient's best interest at this point considering her history of infection. With that being said I think that the ideal thing would probably  be for [place] to see about utilizing Dakin's solution to try to help clean this up. I explained to what this is and how this works and how it should be used to the patient today. Electronic Signature(s) Signed: 11/08/2020 4:50:35 PM By: Lenda Kelp PA-C Signed: 11/08/2020 4:50:35 PM By: Lenda Kelp PA-C Entered By: Lenda Kelp on 11/08/2020 16:50:35 -------------------------------------------------------------------------------- Physician Orders Details Patient Name: Date of Service: Alison Monroe, Alison A. 11/08/2020 10:30 A M Medical Record Number: [id number] Patient Account Number: [id number] Date of Birth/Sex: Treating RN: 1961/11/24 (59 y.o. 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 Dressing Change Frequency Wound #2 Distal Back Change dressing every day. Wound Cleansing Clean wound with Normal Saline. - or wound cleanser Primary Wound Dressing Wound #2 Distal  Back Other: - Dakin's .25% solution moistened gauze packing Secondary Dressing Wound #2 Distal Back Foam Border - or ABD Home Health Continue Home Health skilled nursing for wound care. - [facility] Patient Medications llergies: penicillin, ampicillin, dextran, adhesive tape, silicone, erythromycin base, metronidazole, naproxen, penicillin G benzathine, vancomycin, suture A Notifications  Medication Indication Start End 11/08/2020 Dakin's Solution DOSE miscellaneous 0.25 % solution - Moisten gauze with Dakin solution then squeeze out excess until the gauze is just damp before packing gauze into the back wound to fill the Electronic Signature(s) Signed: 11/08/2020 3:45:51 PM By: Lenda Kelp PA-C Entered By: Lenda Kelp on 11/08/2020 15:45:50 -------------------------------------------------------------------------------- Problem List Details Patient Name: Date of Service: Alison Monroe, Alison A. 11/08/2020 10:30 A M Medical Record Number: [id number] Patient Account Number: [id number] Date of Birth/Sex: Treating RN: [birth year] (59 y.o. 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 11/08/2020 10:30 A M Medical Record Number: [id number] Patient Account Number: [id number] Date of Birth/Sex: Treating RN: February 21, 1961 (59 y.o. Alison Monroe Primary Care Provider: Orland Mustard Other Clinician: Referring  Provider: Treating Provider/Extender: Elvin So in Treatment: 0 Subjective Chief Complaint Information obtained from Patient Patient is here for review of her chronic wound on her lower lumbar spine History of Present Illness (HPI) ADMISSION 09/15/2018 This is a 59 year old woman who has had chronic problems with low back pain dating back many years. Her original surgery was in 1997 after a fall in 1993. She had a large initial surgery involving fusion from T9 to her sacral spine.. That surgery was complicated by multiple infections and revisions. She had plastic surgery involvement with muscle flaps. She had revision of her surgery in 2014 I believe in [place]. She had IV antibiotics for a prolonged period of time at that time and for 2 years as far as I am following her this actually remained closed only to reopen again in roughly 2016. She has been treated with multiple courses of IV antibiotics and has been on intermittent oral antibiotics. The patient stated that the wound reclosed sometime in 2017 but then reopened 8 months ago and they will open and close episodically since then. She was seen at [facility] by neurosurgery who apparently recommended hardware removal with plastic surgery and delayed open closure. This was in 07/16/2018. On arrival to [facility] on 9/18 she had increased purulent drainage. This was extensively worked up. She was not a candidate for an MRI secondary to hardware. Her CRP was 21.4 and her sed rate was 111. Plastic surgery was consulted but they felt that the patient would ultimately need hardware removal before they could help. Infectious disease saw the patient who recommended a gallium scan to evaluate for osteomyelitis. Patient underwent a nuclear medicine gallium scan which showed no areas of abnormal gallium and  no osteomyelitis. Infectious disease therefore did not think she required antibiotics. I do not see any cultures. There were  difficulty arranging home health for her as she is not a [address] resident and did not have a primary physician. The patient actually lives in [place] as her primary residence. The patient states that she is noted intermittent drainage coming out of this. She also states that she is intermittently febrile into the 99+ range which is high for her. She is also had episodic fevers that are short-lived to 103. She is in a lot of pain and is on a aggressive narcotic regimen. 09/29/2018; this is a patient with a draining surgical wound on her lower lumbar spine. Culture of this I did in the clinic was MSSA I gave her some doxycycline. She states this made her nauseated but she is managing to tolerate it. I spoke to her on the phone last week to update her on the results. At the time I thought she was going to infectious disease on Monday where as apparently it is Thursday i.e. in 2 days time. She is using Aquacel Ag and really she is using this twice a day. I do not completely understand how much how she is going through is much of the Aquacel as she is given the wound area however I understand with the amount of purulent drainage here the need to change this twice a day. She states she has low-grade fevers for her which is in the high a 98.9 99 range but she has not had any other symptoms she has not been systemically unwell. On top of this she is going back to [place] I believe in the [place] area on Saturday. She is going to need to have her primary doctor arrange home care for her when she gets back there. She apparently is coming back to [place] sometime in mid November. I had an extensive discussion with her over the phone about her options with regards to doing nothing versus undergoing the commando multiphase surgical approach that was outlined by her neurosurgeon at [facility]. My general feeling is that she is going to have to go ahead with the surgical approach at some  point. She is just not ready to make this decision now 11/06/2018; patient has returned from [place]. She is on Bactrim DS as apparently prescribed by infectious disease down in [place]. She states the goal was here to have this on board indefinitely. The patient also saw Dr. Verdie Drown on 10/01/2018. He recommended her to go back to [facility] for the surgery that was previously discussed to remove her hardware. He did not specifically recommend suppressive antibiotics but was on board with follow-up after surgery. The patient is nowhere near a decision for surgery in her mind she wants to wait until she is "sick". I warned her that sepsis can sometimes make people very sick or can urgently make people live threateningly ill she expressed understanding and states that she is already been in that situation. We also have had calls from her insurance company about organizing ongoing home care. Our case management nurse is already reached out to to home health nursing agency without success because of staffing issues. The patient has been on Aquacel Ag rope packing. She feels this needs to be changed twice a day which is another issue. She has not been systemically unwell 12/07/2018; patient returns and states she is in [place] for another month and a half. She is looking for  supplies specifically Aquacel Ag. She has not been systemically unwell no fever no chills. She states the wounds are roughly the same, including the amount of drainage. She stated at the end that there may still be some more pain to the right side of the wound areas. Readmission: 11/08/2020 upon evaluation today patient appears to be doing somewhat poorly here upon readmission with regard to her back ulcer although not as badly as she was in the past. She does have some necrotic tissue noted in the base of the wound and the home health nurse felt that they did need some direction as far as what would be the best way to go forward  right now for the patient. She did have hardware removal January 2021. Following this she was given doxycycline eventually which she is actually still on currently until she goes back for her next surgery. She is also on Dilaudid 8 mg four times a day for pain. She had's home health who has been changing the dressing for her they have been using a silver alginate dressing. With that being said she is actually tentatively going to be scheduled for hardware placement and a muscle flap closure once she improved significantly regard to bone density she was given Forteo to try to help build up her bone strength. 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 I do not see any overt infection at this point externally upon evaluation of the wound. There is some necrotic tissue at the base of the wound. Patient History Information obtained from Patient. Allergies penicillin (Severity: Moderate, Reaction: swelling, rash), ampicillin (Severity: Moderate, Reaction: rash), dextran, adhesive tape, silicone, erythromycin base, metronidazole, naproxen, penicillin G benzathine, vancomycin, suture Family History Cancer - Paternal Grandparents, Diabetes - Mother,Father,Maternal Grandparents,Siblings, Heart Disease - Mother,Father,Siblings, Kidney Disease - Father, Lung Disease - Mother,Siblings, Stroke - Father, No family history of Hereditary Spherocytosis, Hypertension, Seizures, Thyroid Problems, Tuberculosis. Social History Never smoker, Marital Status - Single, Alcohol Use - Never, Drug Use - No History, Caffeine Use - Daily. Medical History Eyes Patient has history of Cataracts - both removed Denies history of Glaucoma, Optic Neuritis Ear/Nose/Mouth/Throat Denies history of Chronic sinus problems/congestion, Middle ear problems Hematologic/Lymphatic Patient has  history of Anemia Denies history of Hemophilia, Human Immunodeficiency Virus, Lymphedema, Sickle Cell Disease Respiratory Denies history of Aspiration, Asthma, Chronic Obstructive Pulmonary Disease (COPD), Pneumothorax, Sleep Apnea, Tuberculosis Cardiovascular Denies history of Angina, Arrhythmia, Congestive Heart Failure, Coronary Artery Disease, Deep Vein Thrombosis, Hypertension, Hypotension, Myocardial Infarction, Peripheral Arterial Disease, Peripheral Venous Disease, Phlebitis, Vasculitis Gastrointestinal Denies history of Cirrhosis , Colitis, Crohnoos, Hepatitis A, Hepatitis B, Hepatitis C Endocrine Denies history of Type I Diabetes, Type II Diabetes Genitourinary Patient has history of End Stage Renal Disease - stage 3 Immunological Denies history of Lupus Erythematosus, Raynaudoos, Scleroderma Integumentary (Skin) Denies history of History of Burn Musculoskeletal Denies history of Gout, Rheumatoid Arthritis, Osteoarthritis, Osteomyelitis Neurologic Patient has history of Neuropathy - bi lat feet Denies history of Dementia, Quadriplegia, Paraplegia, Seizure Disorder Oncologic Denies history of Received Chemotherapy, Received Radiation Psychiatric Denies history of Anorexia/bulimia, Confinement Anxiety Hospitalization/Surgery History - back surgery hardware removed at [facility] 12/31/2019. 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 Ear/Nose/Mouth/Throat Denies complaints or symptoms of Chronic sinus problems or rhinitis. Respiratory Denies complaints or symptoms of Chronic or frequent coughs, Shortness of Breath. Cardiovascular Denies complaints or symptoms of Chest pain. Gastrointestinal Denies complaints or symptoms of Frequent diarrhea,  Nausea, Vomiting. Endocrine Denies complaints or symptoms of Heat/cold intolerance. Genitourinary Denies complaints or symptoms of Frequent urination. Integumentary (Skin) Complains or has symptoms of Wounds - back. Musculoskeletal Denies complaints or symptoms of Muscle Pain, Muscle Weakness. Neurologic Denies complaints or symptoms of Numbness/parasthesias. Psychiatric Denies complaints or symptoms of Claustrophobia, Suicidal. Objective Constitutional patient is hypertensive.. pulse regular and within target range for patient.Marland Kitchen respirations regular, non-labored and within target range for patient.Marland Kitchen temperature within target range for patient.. Well-nourished and well-hydrated in no acute distress. Vitals Time Taken: 11:30 AM, Height: 64 in, Source: Stated, Weight: 195 lbs, Source: Stated, BMI: 33.5, Temperature: 98.3 F, Pulse: 102 bpm, Respiratory Rate: 20 breaths/min, Blood Pressure: 155/93 mmHg. Eyes conjunctiva clear no eyelid edema noted. pupils equal round and reactive to light and accommodation. Ears, Nose, Mouth, and Throat no gross abnormality of ear auricles or external auditory canals. normal hearing noted during conversation. mucus membranes moist. Respiratory normal breathing without difficulty. Cardiovascular no clubbing, cyanosis, significant edema, Musculoskeletal normal gait and posture. no significant deformity or arthritic changes, no loss or range of motion, no clubbing. Psychiatric this patient is able to make decisions and demonstrates good insight into disease process. Alert and Oriented x 3. pleasant and cooperative. General Notes: Upon inspection patient's wound bed did show some signs of minimal slough noted in the base of the wound. I do think something to help clean this up would be of benefit. I really think that aggressive sharp debridement is probably not in the patient's best interest at this point considering her history of infection. With that  being said I think that the ideal thing would probably be for [place] to see about utilizing Dakin's solution to try to help clean this up. I explained to what this is and how this works and how it should be used to the patient today. Integumentary (Hair, Skin) Wound #2 status is Open. Original cause of wound was Surgical Injury. The wound is located on the Distal Back. The wound measures 3.4cm length x 2.2cm width x 1.4cm depth; 5.875cm^2 area and 8.225cm^3 volume. There is Fat Layer (Subcutaneous Tissue) exposed. There is no tunneling or undermining noted. There is a medium amount of serosanguineous drainage noted. The wound margin is distinct with the outline attached to the wound base. There is small (1-33%) pink granulation within the wound bed. There is a large (67-100%) amount of necrotic tissue within the wound bed including Adherent Slough. 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 Dressing Change Frequency: Wound #2 Distal Back: Change dressing every day. Wound Cleansing: Clean wound with Normal Saline. - or wound cleanser Primary Wound Dressing: Wound #2 Distal Back: Other: - Dakin's .25% solution moistened gauze packing Secondary Dressing: Wound #2 Distal Back: Foam Border - or ABD Home Health: Continue Home Health skilled nursing for wound care. - [facility] The following medication(s) was prescribed: Dakin's Solution miscellaneous 0.25 % solution Moisten gauze with Dakin solution then squeeze out excess until the gauze  is just damp before packing gauze into the back wound to fill the starting  11/08/2020 1. Would recommend currently that we going to continue with the wound care measures as before specifically with regard to the packing of the wound although one to make sure this is packed lightly. 2. I am also can recommend we switch from a silver alginate dressing to a saline moistened gauze dressing that should not be too wet and I want them to do this daily. 3. I am also can recommend the patient continue to monitor for any signs of worsening infection. Obviously I do not see anything right now that seems to be problematic to me we are going to recheck with her in 2 weeks so to make sure that she is seeing improvement in that everything appears to be doing better. We will see patient back for reevaluation in 2 weeks here in the clinic. If anything worsens or changes patient will contact our office for additional recommendations. Electronic Signature(s) Signed: 11/08/2020 4:51:22 PM By: Lenda Kelp PA-C Entered By: Lenda Kelp on 11/08/2020 16:51:22 -------------------------------------------------------------------------------- HxROS Details Patient Name: Date of Service: Alison Monroe, Alison A. 11/08/2020 10:30 A M Medical Record Number: [id number] Patient Account Number: [id number] Date of Birth/Sex: Treating RN: 08/19/1961 (59 y.o. Arta Silence Primary Care Provider: Orland Mustard Other Clinician: Referring Provider: Treating Provider/Extender: Elvin So in Treatment: 0 Information Obtained From Patient Constitutional Symptoms (General Health) Complaints and Symptoms: Negative for: Fatigue; Fever; Chills; Marked Weight Change Medical History: Past Medical History Notes: COVID 03/2020 Ear/Nose/Mouth/Throat Complaints and Symptoms: Negative for: Chronic sinus problems or rhinitis Medical History: Negative for: Chronic sinus problems/congestion; Middle ear problems Respiratory Complaints and Symptoms: Negative for: Chronic or  frequent coughs; Shortness of Breath Medical History: Negative for: Aspiration; Asthma; Chronic Obstructive Pulmonary Disease (COPD); Pneumothorax; Sleep Apnea; Tuberculosis Cardiovascular Complaints and Symptoms: Negative for: Chest pain Medical History: Negative for: Angina; Arrhythmia; Congestive Heart Failure; Coronary Artery Disease; Deep Vein Thrombosis; Hypertension; Hypotension; Myocardial Infarction; Peripheral Arterial Disease; Peripheral Venous Disease; Phlebitis; Vasculitis Past Medical History Notes: 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 Gastrointestinal Complaints and Symptoms: Negative for: Frequent diarrhea; Nausea; Vomiting Medical History: Negative for: Cirrhosis ; Colitis; Crohns; Hepatitis A; Hepatitis B; Hepatitis C Endocrine Complaints and Symptoms: Negative for: Heat/cold intolerance Medical History: Negative for: Type I Diabetes; Type II Diabetes Genitourinary Complaints and Symptoms: Negative for: Frequent urination Medical History: Positive for: End Stage Renal Disease - stage 3 Integumentary (Skin) Complaints and Symptoms: Positive for: Wounds - back Medical History: Negative for: History of Burn Musculoskeletal Complaints and Symptoms: Negative for: Muscle Pain; Muscle Weakness Medical History: Negative for: Gout; Rheumatoid Arthritis; Osteoarthritis; Osteomyelitis Neurologic Complaints and Symptoms: Negative for: Numbness/parasthesias Medical History: Positive for: Neuropathy - bi lat feet Negative for: Dementia; Quadriplegia; Paraplegia; Seizure Disorder Psychiatric Complaints and Symptoms: Negative for: Claustrophobia; Suicidal Medical History: Negative for: Anorexia/bulimia; Confinement Anxiety Eyes Medical History: Positive for: Cataracts - both removed Negative for: Glaucoma; Optic Neuritis Hematologic/Lymphatic Medical History: Positive for: Anemia Negative for: Hemophilia; Human Immunodeficiency Virus; Lymphedema; Sickle Cell  Disease Immunological Medical History: Negative for: Lupus Erythematosus; Raynauds; Scleroderma Oncologic Medical History: Negative for: Received Chemotherapy; Received Radiation HBO Extended History Items Eyes: Cataracts Immunizations Pneumococcal Vaccine: Received Pneumococcal Vaccination: Yes Implantable Devices No devices added Hospitalization / Surgery History Type of Hospitalization/Surgery back surgery hardware removed at [facility] 12/31/2019 Family and Social History Cancer: Yes - Paternal Grandparents; Diabetes: Yes - Mother,Father,Maternal Grandparents,Siblings; Heart Disease: Yes - Mother,Father,Siblings; Hereditary Spherocytosis:  No; Hypertension: No; Kidney Disease: Yes - Father; Lung Disease: Yes - Mother,Siblings; Seizures: No; Stroke: Yes - Father; Thyroid Problems: No; Tuberculosis: No; Never smoker; Marital Status - Single; Alcohol Use: Never; Drug Use: No History; Caffeine Use: Daily; Financial Concerns: No; Food, Clothing or Shelter Needs: No; Support System Lacking: No; Transportation Concerns: No Electronic Signature(s) Signed: 11/08/2020 4:29:16 PM By: Shawn Stall Signed: 11/08/2020 5:04:05 PM By: Lenda Kelp PA-C Entered By: Shawn Stall on 11/08/2020 11:55:52 -------------------------------------------------------------------------------- SuperBill Details Patient Name: Date of Service: Alison Monroe, Alison A. 11/08/2020 Medical Record Number: [id number] Patient Account Number: [id number] Date of Birth/Sex: Treating RN: May 29, 1961 (59 y.o. 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 B95.61 Methicillin  susceptible Staphylococcus aureus infection as the cause of diseases classified elsewhere T81.42XD Infection following a procedure, deep incisional surgical site, subsequent encounter Physician Procedures : CPT4 Code Description Modifier [id number] - WC PHYS LEVEL 4 - EST PT ICD-10 Diagnosis Description T81.31XA Disruption of external operation (surgical) wound, not elsewhere classified, initial encounter L98.422 Non-pressure chronic ulcer of back with  fat layer exposed B95.61 Methicillin susceptible Staphylococcus aureus infection as the cause of diseases classified elsewhe T81.42XD Infection following a procedure, deep incisional surgical site, subsequent encounter Quantity: 1 re Electronic Signature(s) Signed: 11/08/2020 4:51:36 PM By: Lenda Kelp PA-C Entered By: Lenda Kelp on 11/08/2020 16:51:36

## 2020-11-15 ENCOUNTER — Telehealth: Payer: Self-pay

## 2020-11-15 NOTE — Telephone Encounter (Signed)
[facility]rapist wants Dr. Artis Flock to know they were going to discharge the pt, since she was going back to [facility]. Now, she is not so they are going to send a new order for physical therapy. 1 time a week for 5 weeks

## 2020-11-15 NOTE — Telephone Encounter (Signed)
Noted.  Kenta Laster, MD [facility]   

## 2020-11-16 ENCOUNTER — Ambulatory Visit (INDEPENDENT_AMBULATORY_CARE_PROVIDER_SITE_OTHER): Payer: Medicare Other | Admitting: Family Medicine

## 2020-11-16 ENCOUNTER — Telehealth: Payer: Self-pay

## 2020-11-16 ENCOUNTER — Other Ambulatory Visit: Payer: Self-pay

## 2020-11-16 ENCOUNTER — Encounter: Payer: Self-pay | Admitting: Family Medicine

## 2020-11-16 ENCOUNTER — Encounter: Payer: Self-pay | Admitting: Endocrinology

## 2020-11-16 VITALS — BP 132/80 | HR 95 | Temp 97.6°F | Ht 64.0 in | Wt 216.8 lb

## 2020-11-16 DIAGNOSIS — H538 Other visual disturbances: Secondary | ICD-10-CM

## 2020-11-16 DIAGNOSIS — I10 Essential (primary) hypertension: Secondary | ICD-10-CM

## 2020-11-16 DIAGNOSIS — I480 Paroxysmal atrial fibrillation: Secondary | ICD-10-CM

## 2020-11-16 MED ORDER — METOPROLOL SUCCINATE ER 25 MG PO TB24
12.5000 mg | ORAL_TABLET | Freq: Every day | ORAL | 3 refills | Status: DC
Start: 2020-11-16 — End: 2020-12-14

## 2020-11-16 NOTE — Progress Notes (Signed)
Patient: Alison Monroe MRN: [id number] DOB: January 02, 1961 PCP: Orland Mustard, MD     Subjective:  Chief Complaint  Patient presents with  . Hypertension  . Tachycardia    HPI: The patient is a 59 y.o. female who presents today for HTN and complaints of intermittent fast heart rate, possibly her afib.   She was started on forteo (about 2 weeks ago) to help with her osteoporosis so bones will hopefully get denser for upcoming surgery. She is also on prolia as well. She thinks it may be messing with her heart and feels like she is having paroxysmal afib. She also states her pulse will go "sky high" to 150. It will last for about an hour. She does not get short of breath. She has to sit down and clam down. Unsure if related to forteo.   She has had some high blood pressure readings and is here for follow up. She has a handout of a few readings. Average 130-140/80.   Blurry vision with a headache. Not associated with heart rate. The blurry vision has been chronic in the past. She has not had her eyes checked recently. Last eye exam was 4 years ago. No double vision. She has wondering eyes.   Review of Systems  Constitutional: Negative for diaphoresis, fatigue and fever.  Eyes: Positive for visual disturbance. Negative for photophobia and pain.  Respiratory: Negative for cough and shortness of breath.   Cardiovascular: Positive for palpitations. Negative for chest pain and leg swelling.  Gastrointestinal: Negative for abdominal pain, blood in stool, diarrhea and nausea.  Musculoskeletal: Positive for back pain.    Allergies Patient is allergic to erythromycin, erythromycin base, metronidazole, other, penicillins, vancomycin, adhesive  [tape], and ampicillin.  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  Surgical History Patient  has a past surgical history that  includes Gallbladder surgery (1981); Breast biopsy; Lumbar spine surgery; Gastric bypass; Ankle surgery (Left); Cataract extraction (2015); and Total knee arthroplasty (Right, 2015).  Family History Pateint's family history includes Arthritis in her maternal grandmother and mother; Asthma in her maternal grandmother and mother; COPD in her father and mother; Cancer in her maternal grandfather; Depression in her mother; Diabetes in her brother, father, maternal grandmother, and mother; Early death in her father and mother; Heart attack in her father and mother; Heart disease in her brother and mother; Hyperlipidemia in her brother and mother; Hypertension in her brother, father, and mother; Kidney disease in her father; Stroke in her father.  Social History Patient  reports that she has never smoked. She has never used smokeless tobacco. She reports that she does not drink alcohol and does not use drugs.    Objective: Vitals:   11/16/20 1005  BP: 132/80  Pulse: 95  Temp: 97.6 F (36.4 C)  TempSrc: Temporal  SpO2: 99%  Weight: 216 lb 12.8 oz (98.3 kg)  Height: 5\' 4"  (1.626 m)    Body mass index is 37.21 kg/m.  Physical Exam Vitals reviewed.  Constitutional:      Appearance: Normal appearance. She is obese.  HENT:     Head: Normocephalic and atraumatic.  Eyes:     Extraocular Movements: Extraocular movements intact.     Pupils: Pupils are equal, round, and reactive to light.  Cardiovascular:     Rate and Rhythm: Regular rhythm. Tachycardia present.     Heart sounds: Normal heart sounds.  Pulmonary:  Effort: Pulmonary effort is normal.     Breath sounds: Normal breath sounds.  Abdominal:     General: Bowel sounds are normal.     Palpations: Abdomen is soft.  Neurological:     General: No focal deficit present.     Mental Status: She is alert and oriented to person, place, and time.  Psychiatric:        Mood and Affect: Mood normal.        Behavior: Behavior normal.         EKG: sinus tachycardia with rate of 101.   Assessment/plan: 1. Paroxysmal A-fib (HCC) EKg with sinus tachycardia. Change from her last EKG this year. I do wonder if forteo is the cause of the tachycardia. Unsure how transient this is in nature after initiation of the drug. im going to start her on low dose beta blocker as she tells me she was unable to tolerate large doses of this. Will do 12.5mg  and this will help with rate and give some BP control.  Readings are better though. Has f/u with cardiology next week. Already anticoagulated. Routine labs/tsh.  Precautions given.  - EKG 12-Lead - CBC with Differential/Platelet; Future - TSH; Future - COMPLETE METABOLIC PANEL WITH GFR; Future - COMPLETE METABOLIC PANEL WITH GFR - TSH - CBC with Differential/Platelet  2. Benign essential HTN Much improved today. Readings closer to goal. Low dose beta blocker for more rate control with hopeful blood pressure improvement. F/u with me in 3 months.   3. Blurry vision, bilateral No red flags on exam today. Has been chronic issue in nature. Not new and not related to tachycardia. Referral to ophthalmology and er precautions given.  - Ambulatory referral to Ophthalmology  She is really stressed out living with her brother and possibly selling her condo in [place]. We talked about changing her to prozac, but want to get her heart rate under control. She will email me in a week or two.   Also mentioned memory issues (mild) she thinks due to stress, discussed she will have to come back for another appointment as it requires more time with testing.   This visit occurred during the SARS-CoV-2 public health emergency.  Safety protocols were in place, including screening questions prior to the visit, additional usage of staff PPE, and extensive cleaning of exam room while observing appropriate contact time as indicated for disinfecting solutions.     Return in about 3 months (around 02/14/2021) for  memory loss/follow up .   Orland Mustard, MD [facility]   11/16/2020

## 2020-11-16 NOTE — Patient Instructions (Addendum)
-  sending in beta blocker for blood pressure and heart rate control with hx of a fib. Please let me know if any issues.   -blurry vision: referral done to eye doc.   -come back for memory loss, takes a lot of time as it's testing/etc.

## 2020-11-16 NOTE — Telephone Encounter (Signed)
Patient is calling in asking if Dr.Wolfe could put orders in for sed rate for infectious disease doctor so she isnt doing labs twice in one day.

## 2020-11-17 ENCOUNTER — Encounter: Payer: Self-pay | Admitting: Physician Assistant

## 2020-11-17 ENCOUNTER — Ambulatory Visit: Payer: Medicare Other | Admitting: Physician Assistant

## 2020-11-17 ENCOUNTER — Telehealth: Payer: Self-pay | Admitting: Radiology

## 2020-11-17 ENCOUNTER — Other Ambulatory Visit: Payer: Self-pay | Admitting: Physician Assistant

## 2020-11-17 VITALS — BP 134/82 | HR 67

## 2020-11-17 DIAGNOSIS — I480 Paroxysmal atrial fibrillation: Secondary | ICD-10-CM | POA: Diagnosis not present

## 2020-11-17 DIAGNOSIS — I513 Intracardiac thrombosis, not elsewhere classified: Secondary | ICD-10-CM

## 2020-11-17 DIAGNOSIS — M810 Age-related osteoporosis without current pathological fracture: Secondary | ICD-10-CM

## 2020-11-17 DIAGNOSIS — E119 Type 2 diabetes mellitus without complications: Secondary | ICD-10-CM

## 2020-11-17 DIAGNOSIS — N183 Chronic kidney disease, stage 3 unspecified: Secondary | ICD-10-CM | POA: Diagnosis not present

## 2020-11-17 LAB — CBC WITH DIFFERENTIAL/PLATELET
Absolute Monocytes: 458 cells/uL (ref 200–950)
Basophils Absolute: 23 cells/uL (ref 0–200)
Basophils Relative: 0.3 %
Eosinophils Absolute: 128 cells/uL (ref 15–500)
Eosinophils Relative: 1.7 %
HCT: 38.1 % (ref 35.0–45.0)
Hemoglobin: 12.2 g/dL (ref 11.7–15.5)
Lymphs Abs: 1613 cells/uL (ref 850–3900)
MCH: 26.3 pg — ABNORMAL LOW (ref 27.0–33.0)
MCHC: 32 g/dL (ref 32.0–36.0)
MCV: 82.1 fL (ref 80.0–100.0)
MPV: 9.5 fL (ref 7.5–12.5)
Monocytes Relative: 6.1 %
Neutro Abs: 5280 cells/uL (ref 1500–7800)
Neutrophils Relative %: 70.4 %
Platelets: 316 10*3/uL (ref 140–400)
RBC: 4.64 10*6/uL (ref 3.80–5.10)
RDW: 19.9 % — ABNORMAL HIGH (ref 11.0–15.0)
Total Lymphocyte: 21.5 %
WBC: 7.5 10*3/uL (ref 3.8–10.8)

## 2020-11-17 LAB — COMPLETE METABOLIC PANEL WITH GFR
AG Ratio: 1.2 (calc) (ref 1.0–2.5)
ALT: 22 U/L (ref 6–29)
AST: 22 U/L (ref 10–35)
Albumin: 3.7 g/dL (ref 3.6–5.1)
Alkaline phosphatase (APISO): 79 U/L (ref 37–153)
BUN/Creatinine Ratio: 13 (calc) (ref 6–22)
BUN: 20 mg/dL (ref 7–25)
CO2: 23 mmol/L (ref 20–32)
Calcium: 8.9 mg/dL (ref 8.6–10.4)
Chloride: 103 mmol/L (ref 98–110)
Creat: 1.54 mg/dL — ABNORMAL HIGH (ref 0.50–1.05)
GFR, Est African American: 42 mL/min/{1.73_m2} — ABNORMAL LOW (ref >=60)
GFR, Est Non African American: 37 mL/min/{1.73_m2} — ABNORMAL LOW (ref >=60)
Globulin: 3 g/dL (calc) (ref 1.9–3.7)
Glucose, Bld: 114 mg/dL — ABNORMAL HIGH (ref 65–99)
Potassium: 4.5 mmol/L (ref 3.5–5.3)
Sodium: 141 mmol/L (ref 135–146)
Total Bilirubin: 0.4 mg/dL (ref 0.2–1.2)
Total Protein: 6.7 g/dL (ref 6.1–8.1)

## 2020-11-17 LAB — TSH: TSH: 1.69 mIU/L (ref 0.40–4.50)

## 2020-11-17 NOTE — Patient Instructions (Signed)
Medication Instructions:  Your physician recommends that you continue on your current medications as directed. Please refer to the Current Medication list given to you today.  *If you need a refill on your cardiac medications before your next appointment, please call your pharmacy*  Lab Work: NONE ordered at this time of appointment   If you have labs (blood work) drawn today and your tests are completely normal, you will receive your results only by: Marland Kitchen MyChart Message (if you have MyChart) OR . A paper copy in the mail If you have any lab test that is abnormal or we need to change your treatment, we will call you to review the results.  Testing/Procedures: Your physician has requested you wear a ZIO patch monitor for 14 days.  This is a single patch monitor. [employer] supplies one patch monitor per enrollment. Additional stickers are not available.  Please do not apply patch if you will be having a Nuclear Stress Test, Echocardiogram, Cardiac CT, MRI, or Chest Xray during the time frame you would be wearing the monitor. The patch cannot be worn during these tests. You cannot remove and re-apply the ZIO AT patch monitor.    Your ZIO patch monitor will be sent Fed Ex from [employer] directly to your home address. The monitor may also be mailed to a PO BOX if home delivery is not available. It may take 3-5 days to receive your monitor after you have been enrolled.  Once you have received you monitor, please review enclosed instructions. Your monitor has already been registered assigning a specific monitor serial # to you.    Applying the monitor  Shave hair from upper left chest.  Hold abrader disc by orange tab. Rub abrader in 40 strokes over left upper chest as indicated in your monitor instructions.  Clean area with 4 enclosed alcohol pads. Use all pads to ensure the area is cleaned thoroughly. Let dry.  Apply patch as indicated in monitor instructions. Patch will be placed  under collarbone on left side of chest with arrow pointing upward.  Rub patch adhesive wings for 2 minutes. Remove the white label marked "1". Remove the white label marked "2". Rub patch adhesive wings for 2 additional minutes.  While looking in a mirror, press and release button in center of patch. A small green light will flash 3-4 times. This will be your only indicator the monitor has been turned on.  Do not shower for the first 24 hours. You may shower after the first 24 hours.  Press the button if you feel a symptom. You will hear a small click. Record Date, Time and Symptom in the Patient Log.    Starting the Gateway  In your kit there is a Audiological scientist box the size of a cellphone. This is Buyer, retail. It transmits all your recorded data to [facility]. This box must stay within 10 feet of you at all times. Open the box and push the * button. There will be a light that blinks orange and then green a few times. When the light stops blinking, the Gateway is connected to the ZIO patch.  Call [employer] at [phone] to confirm your monitor is transmitting.     Returning your monitor  Remove your patch and place it inside the Gateway. In the lower half of the Gateway there is a white bag with prepaid postage on it. Place Gateway in bag and seal. Mail package back to [facility] as soon as possible. Your physician should  have your final report approximately 7 days after you have mailed back your monitor.    Call [employer] at [phone] if you have questions regarding your ZIO AT patch monitor. Call them immediately if you see an orange light blinking on your monitor.  If your monitor falls off in less than 4 days contact our Monitor department at [phone]. If your monitor becomes loose or falls off after 4 days call [employer] at [phone] for suggestions on securing your monitor.   Follow-Up: At [facility], you and your health needs are our priority.   As part of our continuing mission to provide you with exceptional heart care, we have created designated Provider Care Teams.  These Care Teams include your primary Cardiologist (physician) and Advanced Practice Providers (APPs -  Physician Assistants and Nurse Practitioners) who all work together to provide you with the care you need, when you need it.  Your next appointment:   5 week(s)  The format for your next appointment:   In Person  Provider:   K. Mali Hilty, MD  Other Instructions

## 2020-11-17 NOTE — Progress Notes (Signed)
Cardiology Office Note:    Date:  11/19/2020   ID:  Landry Mellow, DOB July 26, 1961, MRN [id number]  PCP:  Orma Flaming, MD  [place] Cardiologist:  Pixie Casino, MD  [place] Electrophysiologist:  None   Referring MD: Orma Flaming, MD   Chief Complaint  Patient presents with  . Follow-up    seen for Dr. Debara Pickett    History of Present Illness:    Alison Monroe is a 59 y.o. female with a hx of CKD stage III, PAF, DM II, and history of right atrial thrombus.  She has received care at [place], [place] [place] [facility] in the [facility] in the past.  She had significant spinal injury secondary to fall several years ago.  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  Cardiac MRI also confirmed this and it was noted to be at the diaphragmatic portion of the right atrium adjacent to the IVC and in close proximity to the tricuspid valve.  It was not extended into the SVC and IVC.  She had bilateral upper and lower extremity venous Doppler in January 2021 at [facility] that was negative for thrombus.  She had paroxysmal atrial fibrillation during her hospitalization and she was started on 5 mg twice a day of Eliquis.  Patient underwent repeat echocardiogram in June 2021 that showed resolution of the right atrial thrombus.  Patient presents today for cardiology office visit.  She is splitting her time between [place] and here.  At some point, she says she will sell her [employer] and will permanently moved to [place] to be with her brother.  She denies any recent chest pain or significant shortness of breath.  She has been having some palpitation recently especially after starting on Forteo for her severe osteoporosis.  EKG obtained yesterday shows sinus rhythm.  It is unclear to me if [place] is responsible for  increased palpitation and elevated heart rate.  Preliminary database search does not suggest any side effect of tachyarrhythmia on Forteo, however she is very clear that her symptoms started after starting on Forteo.  At this time, I recommended starting on the Forteo as she need help with osteoporosis before any further back surgery can be done.  I recommended 2-week heart monitor to assess for A. fib burden.  On exam, she appears to be euvolemic.   Past Medical History:  Diagnosis Date  . Arthritis   . Asthma   . Atrial thrombus 05/26/2020   Resolved by echo 2021; lifelong anticoagulation  . Chronic kidney disease    Stage 3  . Diabetes ([place])   . Heart murmur     Past Surgical History:  Procedure Laterality Date  . ANKLE SURGERY Left   . BREAST BIOPSY    . CATARACT EXTRACTION  2015  . [place]  . GASTRIC BYPASS    . LUMBAR SPINE SURGERY     Several. Rods, bolts and screws in place  . TOTAL KNEE ARTHROPLASTY Right 2015    Current Medications: Current Meds  Medication Sig  . acetaminophen (TYLENOL) 500 MG tablet Take 1,000 mg by mouth 2 (two) times daily as needed.  Marland Kitchen apixaban (ELIQUIS) 5 MG TABS tablet Take 1 tablet (5 mg total) by mouth 2 (two) times daily.  . Cholecalciferol 25 MCG (1000 UT) capsule Take 6 capsules (6,000 Units total) by  mouth daily.  . cyanocobalamin (,VITAMIN B-12,) 1000 MCG/ML injection Inject 1 mL into the skin every 30 (thirty) days.   . cyclobenzaprine (FLEXERIL) 10 MG tablet Take 1 tablet (10 mg total) by mouth 3 (three) times daily as needed for muscle spasms.  . diclofenac Sodium (VOLTAREN) 1 % GEL Apply 2 g topically in the morning and at bedtime.   . diphenhydrAMINE (BENADRYL) 50 MG capsule Take 50 mg by mouth as needed for allergies or sleep.   Marland Kitchen escitalopram (LEXAPRO) 10 MG tablet Take 20 mg by mouth daily.  . fentaNYL (DURAGESIC - DOSED MCG/HR) 100 MCG/HR Place 1 patch onto the skin every other day.  Marland Kitchen HYDROmorphone (DILAUDID) 8  MG tablet Take 1 tablet by mouth every 6 (six) hours.   . metoprolol succinate (TOPROL-XL) 25 MG 24 hr tablet Take 0.5 tablets (12.5 mg total) by mouth daily.  . Multiple Vitamin (MULTIVITAMIN) tablet Take 1 tablet by mouth daily.  Marland Kitchen omeprazole (PRILOSEC) 20 MG capsule Take 1 capsule by mouth in the morning, at noon, and at bedtime.   . Teriparatide, Recombinant, (FORTEO) 620 MCG/2.48ML SOPN Inject 20 mcg into the skin daily.  Marland Kitchen torsemide (DEMADEX) 20 MG tablet Take 0.5 tablets (10 mg total) by mouth daily.     Allergies:   Erythromycin, Erythromycin base, Other, Penicillins, Vancomycin, Adhesive  [tape], and Ampicillin   Social History   Socioeconomic History  . Marital status: Single    Spouse name: Not on file  . Number of children: 0  . Years of education: Not on file  . Highest education level: Not on file  Occupational History  . Occupation: disabled  Tobacco Use  . Smoking status: Never Smoker  . Smokeless tobacco: Never Used  Vaping Use  . Vaping Use: Never used  Substance and Sexual Activity  . Alcohol use: Never  . Drug use: Never  . Sexual activity: Not on file  Other Topics Concern  . Not on file  Social History Narrative   Living with her brother and his family (strained relationship)    Previously living in [place] prior to last back surgery, plans to return to [place] once she is able to care for herself better    Social Determinants of Health   Financial Resource Strain:   . Difficulty of Paying Living Expenses: Not on file  Food Insecurity:   . Worried About Charity fundraiser in the Last Year: Not on file  . Ran Out of Food in the Last Year: Not on file  Transportation Needs:   . Lack of Transportation (Medical): Not on file  . Lack of Transportation (Non-Medical): Not on file  Physical Activity:   . Days of Exercise per Week: Not on file  . Minutes of Exercise per Session: Not on file  Stress:   . Feeling of Stress : Not on file  Social  Connections:   . Frequency of Communication with Friends and Family: Not on file  . Frequency of Social Gatherings with Friends and Family: Not on file  . Attends Religious Services: Not on file  . Active Member of Clubs or Organizations: Not on file  . Attends Archivist Meetings: Not on file  . Marital Status: Not on file     Family History: The patient's family history includes Arthritis in her maternal grandmother and mother; Asthma in her maternal grandmother and mother; COPD in her father and mother; Cancer in her maternal grandfather; Depression in her mother; Diabetes  in her brother, father, maternal grandmother, and mother; Early death in her father and mother; Heart attack in her father and mother; Heart disease in her brother and mother; Hyperlipidemia in her brother and mother; Hypertension in her brother, father, and mother; Kidney disease in her father; Stroke in her father. There is no history of Osteoporosis.  ROS:   Please see the history of present illness.     All other systems reviewed and are negative.  EKGs/Labs/Other Studies Reviewed:    The following studies were reviewed today:  Echo 05/22/2020 1. Left ventricular ejection fraction, by estimation, is 60 to 65%. The  left ventricle has normal function. The left ventricle has no regional  wall motion abnormalities. Left ventricular diastolic parameters were  normal.  2. Right ventricular systolic function is normal. The right ventricular  size is normal.  3. The mitral valve is normal in structure. No evidence of mitral valve  regurgitation. No evidence of mitral stenosis.  4. The aortic valve is normal in structure. Aortic valve regurgitation is  not visualized. No aortic stenosis is present.   EKG:  EKG is not ordered today.  EKG obtained yesterday showed normal sinus rhythm  Recent Labs: 11/16/2020: ALT 22; BUN 20; Creat 1.54; Hemoglobin 12.2; Platelets 316; Potassium 4.5; Sodium 141; TSH 1.69    Recent Lipid Panel    Component Value Date/Time   CHOL 182 01/12/2019 1102   TRIG 64.0 01/12/2019 1102   HDL 56.10 01/12/2019 1102   CHOLHDL 3 01/12/2019 1102   VLDL 12.8 01/12/2019 1102   LDLCALC 113 (H) 01/12/2019 1102     Risk Assessment/Calculations:     CHA2DS2-VASc Score = 2  This indicates a 2.2% annual risk of stroke. The patient's score is based upon: CHF History: 0 HTN History: 0 Diabetes History: 1 Stroke History: 0 Vascular Disease History: 0 Age Score: 0 Gender Score: 1       Physical Exam:    VS:  BP 134/82   Pulse 67   SpO2 92%     Wt Readings from Last 3 Encounters:  11/16/20 216 lb 12.8 oz (98.3 kg)  10/30/20 219 lb (99.3 kg)  10/03/20 211 lb (95.7 kg)     GEN:  Well nourished, well developed in no acute distress HEENT: Normal NECK: No JVD; No carotid bruits LYMPHATICS: No lymphadenopathy CARDIAC: RRR, no murmurs, rubs, gallops RESPIRATORY:  Clear to auscultation without rales, wheezing or rhonchi  ABDOMEN: Soft, non-tender, non-distended MUSCULOSKELETAL:  No edema; No deformity  SKIN: Warm and dry NEUROLOGIC:  Alert and oriented x 3 PSYCHIATRIC:  Normal affect   ASSESSMENT:    1. PAF (paroxysmal atrial fibrillation) ([place])   2. Controlled type 2 diabetes mellitus without complication, without long-term current use of insulin ([place])   3. Atrial thrombus   4. Stage 3 chronic kidney disease, unspecified whether stage 3a or 3b CKD ([place])   5. Age-related osteoporosis without current pathological fracture    PLAN:    In order of problems listed above:  1. Paroxysmal atrial fibrillation: On Eliquis and metoprolol succinate.  Since she started on Forteo for her osteoporosis, she has been having increasing tachyarrhythmia.  Online database search does not suggest there is a correlation between the two, however patient is clear that symptoms started after Forteo.  Patient is in need of Forteo for her osteoporosis so she can proceed with her  surgery in the future.  We will assess her A. fib burden using a 2-week heart monitor, if  there is significant recurrence, we will consider increase beta-blocker dosage  2. DM2: Managed by primary care provider  3. History of right atrial thrombus: Resolved on echocardiogram.  On Eliquis  4. CKD stage III: Followed by primary care provider  5. History of osteoporosis: She was placed on Forteo in hopes of proceeding with \surgery in the future.    Shared Decision Making/Informed Consent        Medication Adjustments/Labs and Tests Ordered: Current medicines are reviewed at length with the patient today.  Concerns regarding medicines are outlined above.  Orders Placed This Encounter  Procedures  . LONG TERM MONITOR-LIVE TELEMETRY (3-14 DAYS)   No orders of the defined types were placed in this encounter.   Patient Instructions  Medication Instructions:  Your physician recommends that you continue on your current medications as directed. Please refer to the Current Medication list given to you today.  *If you need a refill on your cardiac medications before your next appointment, please call your pharmacy*  Lab Work: NONE ordered at this time of appointment   If you have labs (blood work) drawn today and your tests are completely normal, you will receive your results only by: Marland Kitchen MyChart Message (if you have MyChart) OR . A paper copy in the mail If you have any lab test that is abnormal or we need to change your treatment, we will call you to review the results.  Testing/Procedures: Your physician has requested you wear a ZIO patch monitor for 14 days.  This is a single patch monitor. [employer] supplies one patch monitor per enrollment. Additional stickers are not available.  Please do not apply patch if you will be having a Nuclear Stress Test, Echocardiogram, Cardiac CT, MRI, or Chest Xray during the time frame you would be wearing the monitor. The patch cannot be worn during  these tests. You cannot remove and re-apply the ZIO AT patch monitor.    Your ZIO patch monitor will be sent Fed Ex from [employer] directly to your home address. The monitor may also be mailed to a PO BOX if home delivery is not available. It may take 3-5 days to receive your monitor after you have been enrolled.  Once you have received you monitor, please review enclosed instructions. Your monitor has already been registered assigning a specific monitor serial # to you.    Applying the monitor  Shave hair from upper left chest.  Hold abrader disc by orange tab. Rub abrader in 40 strokes over left upper chest as indicated in your monitor instructions.  Clean area with 4 enclosed alcohol pads. Use all pads to ensure the area is cleaned thoroughly. Let dry.  Apply patch as indicated in monitor instructions. Patch will be placed under collarbone on left side of chest with arrow pointing upward.  Rub patch adhesive wings for 2 minutes. Remove the white label marked "1". Remove the white label marked "2". Rub patch adhesive wings for 2 additional minutes.  While looking in a mirror, press and release button in center of patch. A small green light will flash 3-4 times. This will be your only indicator the monitor has been turned on.  Do not shower for the first 24 hours. You may shower after the first 24 hours.  Press the button if you feel a symptom. You will hear a small click. Record Date, Time and Symptom in the Patient Log.    Starting the Gateway  In your kit there is a small  plastic box the size of a cellphone. This is Airline pilot. It transmits all your recorded data to [facility]. This box must stay within 10 feet of you at all times. Open the box and push the * button. There will be a light that blinks orange and then green a few times. When the light stops blinking, the Gateway is connected to the ZIO patch.  Call [employer] at [phone] to confirm your monitor is  transmitting.     Returning your monitor  Remove your patch and place it inside the [place]. In the lower half of the Gateway there is a white bag with prepaid postage on it. Place Gateway in bag and seal. Mail package back to [place] as soon as possible. Your physician should have your final report approximately 7 days after you have mailed back your monitor.    Call [place] at [phone] if you have questions regarding your ZIO AT patch monitor. Call them immediately if you see an orange light blinking on your monitor.  If your monitor falls off in less than 4 days contact our Monitor department at [phone]. If your monitor becomes loose or falls off after 4 days call [employer] at [phone] for suggestions on securing your monitor.   Follow-Up: At [facility], you and your health needs are our priority.  As part of our continuing mission to provide you with exceptional heart care, we have created designated Provider Care Teams.  These Care Teams include your primary Cardiologist (physician) and Advanced Practice Providers (APPs -  Physician Assistants and Nurse Practitioners) who all work together to provide you with the care you need, when you need it.  Your next appointment:   5 week(s)  The format for your next appointment:   In Person  Provider:   Raliegh Ip Mali Hilty, MD  Other Instructions      Signed, Almyra Deforest, [place]  11/19/2020 7:22 PM    [place]

## 2020-11-17 NOTE — Telephone Encounter (Signed)
Enrolled patient for a 14 day Zio AT monitor to be mailed to patients home.  

## 2020-11-17 NOTE — Telephone Encounter (Signed)
Sorry this is late..they couldn't add on ESR, would have needed another tube.  Dr. Rogers Blocker

## 2020-11-17 NOTE — Telephone Encounter (Signed)
I spoke with the pt to give message below. She says that Home health is able to put an order in and get this done for her.

## 2020-11-19 ENCOUNTER — Encounter: Payer: Self-pay | Admitting: Physician Assistant

## 2020-11-20 ENCOUNTER — Telehealth: Payer: Self-pay | Admitting: Family Medicine

## 2020-11-20 ENCOUNTER — Encounter: Payer: Self-pay | Admitting: Family Medicine

## 2020-11-20 ENCOUNTER — Ambulatory Visit (INDEPENDENT_AMBULATORY_CARE_PROVIDER_SITE_OTHER): Payer: Medicare Other

## 2020-11-20 DIAGNOSIS — I480 Paroxysmal atrial fibrillation: Secondary | ICD-10-CM | POA: Diagnosis not present

## 2020-11-20 NOTE — Telephone Encounter (Signed)
Please call [facility] and give verbal order for a ESR to be drawn. Results can be faxed to me or her ID doc.  Thanks,  Dr. Rogers Blocker

## 2020-11-22 ENCOUNTER — Other Ambulatory Visit: Payer: Self-pay

## 2020-11-22 ENCOUNTER — Encounter (HOSPITAL_BASED_OUTPATIENT_CLINIC_OR_DEPARTMENT_OTHER): Payer: Medicare Other | Attending: Physician Assistant | Admitting: Physician Assistant

## 2020-11-22 DIAGNOSIS — T8142XD Infection following a procedure, deep incisional surgical site, subsequent encounter: Secondary | ICD-10-CM | POA: Insufficient documentation

## 2020-11-22 DIAGNOSIS — T8131XA Disruption of external operation (surgical) wound, not elsewhere classified, initial encounter: Secondary | ICD-10-CM | POA: Diagnosis not present

## 2020-11-22 DIAGNOSIS — Y832 Surgical operation with anastomosis, bypass or graft as the cause of abnormal reaction of the patient, or of later complication, without mention of misadventure at the time of the procedure: Secondary | ICD-10-CM | POA: Insufficient documentation

## 2020-11-22 DIAGNOSIS — B9561 Methicillin susceptible Staphylococcus aureus infection as the cause of diseases classified elsewhere: Secondary | ICD-10-CM | POA: Insufficient documentation

## 2020-11-22 DIAGNOSIS — L98422 Non-pressure chronic ulcer of back with fat layer exposed: Secondary | ICD-10-CM | POA: Diagnosis not present

## 2020-11-22 NOTE — Telephone Encounter (Signed)
Spoke with [facility] to give verbal orders. She voiced understanding and will draw next week, she has already been seen by Nurse this week.

## 2020-11-22 NOTE — Progress Notes (Addendum)
Alison Monroe ([id number]) Visit Report for 11/22/2020 Chief Complaint Document Details Patient Name: Date of Service: Alison Monroe 11/22/2020 9:15 A M Medical Record Number: [id number] Patient Account Number: [id number] Date of Birth/Sex: Treating RN: [birth year] (59 y.o. Tommye Standard Primary Care Provider: Orland Mustard Other Clinician: Referring Provider: Treating Provider/Extender: Elvin So in Treatment: 2 Information Obtained from: Patient Chief Complaint Patient is here for review of her chronic wound on her lower lumbar spine Electronic Signature(s) Signed: 11/22/2020 11:11:04 AM By: Lenda Kelp PA-C Entered By: Lenda Kelp on 11/22/2020 11:11:04 -------------------------------------------------------------------------------- HPI Details Patient Name: Date of Service: Alison Monroe, Alison A. 11/22/2020 9:15 A M Medical Record Number: [id number] Patient Account Number: [id number] Date of Birth/Sex: Treating RN: 1961/11/04 (59 y.o. Tommye Standard Primary Care Provider: Orland Mustard Other Clinician: Referring Provider: Treating Provider/Extender: Elvin So in Treatment: 2 History of Present Illness HPI Description: ADMISSION 09/15/2018 This is a 59 year old woman who has had chronic problems with low back pain dating back many years. Her original surgery was in 1997 after a fall in 1993. She had a large initial surgery involving fusion from T9 to her sacral spine.. That surgery was complicated by multiple infections and revisions. She had plastic surgery involvement with muscle flaps. She had revision of her surgery in 2014 I believe in [place]. She had IV antibiotics for a prolonged period of time at that time and for 2 years as far as I am following her this actually remained closed only to reopen again in roughly 2016. She has been treated with multiple courses of IV antibiotics and has been on  intermittent oral antibiotics. The patient stated that the wound reclosed sometime in 2017 but then reopened 8 months ago and they will open and close episodically since then. She was seen at [facility] by neurosurgery who apparently recommended hardware removal with plastic surgery and delayed open closure. This was in 07/16/2018. On arrival to [facility] on 9/18 she had increased purulent drainage. This was extensively worked up. She was not a candidate for an MRI secondary to hardware. Her CRP was 21.4 and her sed rate was 111. Plastic surgery was consulted but they felt that the patient would ultimately need hardware removal before they could help. Infectious disease saw the patient who recommended a gallium scan to evaluate for osteomyelitis. Patient underwent a nuclear medicine gallium scan which showed no areas of abnormal gallium and no osteomyelitis. Infectious disease therefore did not think she required antibiotics. I do not see any cultures. There were difficulty arranging home health for her as she is not a [address] resident and did not have a primary physician. The patient actually lives in [place] as her primary residence. The patient states that she is noted intermittent drainage coming out of this. She also states that she is intermittently febrile into the 99+ range which is high for her. She is also had episodic fevers that are short-lived to 103. She is in a lot of pain and is on a aggressive narcotic regimen. 09/29/2018; this is a patient with a draining surgical wound on her lower lumbar spine. Culture of this I did in the clinic was MSSA I gave her some doxycycline. She states this made her nauseated but she is managing to tolerate it. I spoke to her on the phone last week to update her on the results. At the time I thought she was going to infectious disease  on Monday where as apparently it is Thursday i.e. in 2 days time. She is using Aquacel Ag and really she is using  this twice a day. I do not completely understand how much how she is going through is much of the Aquacel as she is given the wound area however I understand with the amount of purulent drainage here the need to change this twice a day. She states she has low-grade fevers for her which is in the high a 98.9 99 range but she has not had any other symptoms she has not been systemically unwell. On top of this she is going back to [place] believe in the [place] area on Saturday. She is going to need to have her primary doctor arrange home care for her when she gets back there. She apparently is coming back to [place] sometime in mid November. I had an extensive discussion with her over the phone about her options with regards to doing nothing versus undergoing the commando multiphase surgical approach that was outlined by her neurosurgeon at [facility]. My general feeling is that she is going to have to go ahead with the surgical approach at some point. She is just not ready to make this decision now 11/06/2018; patient has returned from [place]. She is on Bactrim DS as apparently prescribed by infectious disease down in [place]. She states the goal was here to have this on board indefinitely. The patient also saw Dr. Verdie Drownolmer on 10/01/2018. He recommended her to go [facility] the surgery that was previously discussed to remove her hardware. He did not specifically recommend suppressive antibiotics but was on board with follow-up after surgery. The patient is nowhere near a decision for surgery in her mind she wants to wait until she is "sick". I warned her that sepsis can sometimes make people very sick or can urgently make people live threateningly ill she expressed understanding and states that she is already been in that situation. We also have had calls from her insurance company about organizing ongoing home care. Our case management nurse is already reached out to to home health  nursing agency without success because of staffing issues. The patient has been on Aquacel Ag rope packing. She feels this needs to be changed twice a day which is another issue. She has not been systemically unwell 12/07/2018; patient returns and states she is in [place] for another month and a half. She is looking for supplies specifically Aquacel Ag. She has not been systemically unwell no fever no chills. She states the wounds are roughly the same, including the amount of drainage. She stated at the end that there may still be some more pain to the right side of the wound areas. Readmission: 11/08/2020 upon evaluation today patient appears to be doing somewhat poorly here upon readmission with regard to her back ulcer although not as badly as she was in the past. She does have some necrotic tissue noted in the base of the wound and the home health nurse felt that they did need some direction as far as what would be the best way to go forward right now for the patient. She did have hardware removal January 2021. Following this she was given doxycycline eventually which she is actually still on currently until she goes back for her next surgery. She is also on Dilaudid 8 mg four times a day for pain. She had's home health who has been changing the dressing for her  they have been using a silver alginate dressing. With that being said she is actually tentatively going to be scheduled for hardware placement and a muscle flap closure once she improved significantly regard to bone density she was given Forteo to try to help build up her bone strength. 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 I do not see any overt infection at this point externally upon evaluation of the wound. There is some necrotic tissue at the base of the wound. 11/22/2020 on  evaluation today patient's wound actually appears to be doing about the same at this time. Fortunately there does not appear to be any signs of active infection at this time systemically which is great news. With that being said she did see Dr. Reginold Agent her infectious disease specialist in his opinion was he does not want the wound to close as he does feel that the patient still has osteomyelitis actively present in the bone with a sinus tract extending to the hardware. With that being said her surgeon is putting off the surgery until at least February the trying to improve her osteoporosis state before going in for surgery to give her the best and at most optimal ability to get this closed and under control. With that being said I do think that with there being an infection active at this point potentially the hyperbarics could potentially improve her overall bone status in order to also help with a more successful surgery in the end. Nonetheless I am not sure the best way to accomplish this from a standpoint of timing and we may need to get in touch with her surgeon in order to coordinate. We did get that information for phone number today as well. Electronic Signature(s) Signed: 11/22/2020 11:26:36 AM By: Lenda Kelp PA-C Entered By: Lenda Kelp on 11/22/2020 11:26:36 -------------------------------------------------------------------------------- Physical Exam Details Patient Name: Date of Service: Alison Monroe, Alison A. 11/22/2020 9:15 A M Medical Record Number: [id number] Patient Account Number: [id number] Date of Birth/Sex: Treating RN: 1961-04-12 (59 y.o. Tommye Standard Primary Care Provider: Orland Mustard Other Clinician: Referring Provider: Treating Provider/Extender: Elvin So in Treatment: 2 Constitutional Well-nourished and well-hydrated in no acute distress. Respiratory normal breathing without difficulty. Psychiatric this patient is able to  make decisions and demonstrates good insight into disease process. Alert and Oriented x 3. pleasant and cooperative. Notes His wound bed actually showed signs of good granulation at this time. There does not appear to be any evidence of worsening active infection which is great news. With that being said she does have obviously a chronic osteomyelitis. With that being said I think the Dakin's keep things clean but not necessarily good to close this wound obviously. The good news is no one wants this closed any way they feel like he needs to stay open until surgery which I completely agree with. Electronic Signature(s) Signed: 11/22/2020 11:29:58 AM By: Lenda Kelp PA-C Entered By: Lenda Kelp on 11/22/2020 11:29:58 -------------------------------------------------------------------------------- Physician Orders Details Patient Name: Date of Service: Alison Monroe, Alison A. 11/22/2020 9:15 A M Medical Record Number: [id number] Patient Account Number: [id number] Date of Birth/Sex: Treating RN: 1961/04/29 (59 y.o. 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 Bathing/ Shower/ Hygiene May shower with protection but do not get wound dressing(s) wet. Home Health No change in wound care orders this week; continue Home Health for wound care. May utilize formulary  equivalent dressing for wound treatment orders unless otherwise specified. Other Home Health Orders/Instructions: - [facility] Wound Treatment Wound #2 - Back Wound Laterality: Distal Prim Dressing: Dakin's .25% slution moistened gauze (Home Health) 1 x Per Day/30 Days ary Discharge Instructions: pack lightly into woiund Secondary Dressing: ComfortFoam Border, 4x4 in (silicone border) 1 x Per Day/30 Days Discharge Instructions: Apply over primary dressing as directed. Electronic Signature(s) Signed: 11/22/2020 4:01:55 PM By: Lenda Kelp PA-C Signed: 11/22/2020 6:12:31 PM By: Zenaida Deed RN, BSN Entered By: Zenaida Deed on 11/22/2020 11:27:48 -------------------------------------------------------------------------------- Problem List Details Patient Name: Date of Service: Alison Monroe, Alison A. 11/22/2020 9:15 A M Medical Record Number: [id number] Patient Account Number: [id number] Date of Birth/Sex: Treating RN: February 12, 1961 (59 y.o. 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 11/22/2020 9:15 A M Medical Record Number: [id number] Patient Account Number: [id number] Date of Birth/Sex: Treating RN: Apr 07, 1961 (59 y.o. 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 Her original surgery was in 1997 after a fall in 1993. She had a large initial surgery involving fusion from T9 to her sacral spine.. That surgery was complicated by multiple infections and revisions. She had plastic surgery involvement with muscle flaps. She had revision of her surgery in 2014 I believe in [place]. She had IV antibiotics for a prolonged period of time at that time and for 2 years as far as I am following her this actually remained closed only to reopen again in roughly 2016. She has been treated with multiple courses of IV antibiotics and has been on intermittent oral antibiotics. The patient stated that the wound reclosed sometime in 2017 but then reopened 8 months ago and they will open and close episodically since then. She was seen at [facility] by neurosurgery who apparently recommended hardware removal with plastic surgery and delayed open closure. This was in 07/16/2018. On arrival to [facility] on 9/18  she had increased purulent drainage. This was extensively worked up. She was not a candidate for an MRI secondary to hardware. Her CRP was 21.4 and her sed rate was 111. Plastic surgery was consulted but they felt that the patient would ultimately need hardware removal before they could help. Infectious disease saw the  patient who recommended a gallium scan to evaluate for osteomyelitis. Patient underwent a nuclear medicine gallium scan which showed no areas of abnormal gallium and no osteomyelitis. Infectious disease therefore did not think she required antibiotics. I do not see any cultures. There were difficulty arranging home health for her as [place] [address] [place] and did not have a primary physician. The [place] in [place] as her primary residence. The patient states that she is noted intermittent drainage coming out of this. She also states that she is intermittently febrile into the 99+ range which is high for her. She is also had episodic fevers that are short-lived to 103. She is in a lot of pain and is on a aggressive narcotic regimen. 09/29/2018; this is a patient with a draining surgical wound on her lower lumbar spine. Culture of this I did in the clinic was MSSA I gave her some doxycycline. She states this made her nauseated but she is managing to tolerate it. I spoke to her on the phone last week to update her on the results. At the time I thought she was going to infectious disease on Monday where as apparently it is Thursday i.e. in 2 days time. She is using Aquacel Ag and really she is using this twice a day. I do not completely understand how much how she is going through is much of the Aquacel as she is given the wound area however I understand with the amount of purulent drainage here the need to change this twice a day. She states she has low-grade fevers for her which is in the high a 98.9 99 range but she has not had any other symptoms she has not been systemically unwell. On top of this she is going back to [place] the [place] area on Saturday. She is going to need to have her primary doctor arrange home care for her when she gets back there. She apparently is coming back to [place] sometime in mid November. I had an  extensive discussion with her over the phone about her options with regards to doing nothing versus undergoing the commando multiphase surgical approach that was outlined by her neurosurgeon at [facility]. My general feeling is that she is going to have to go ahead with the surgical approach at some point. She is just not ready to make this decision now 11/06/2018; patient has returned from [place]. She is on Bactrim DS as apparently prescribed by infectious disease down in [place]. She states the goal was here to have this on board indefinitely. The patient also saw Dr. Verdie Drownolmer on 10/01/2018. He recommended her to go back to [facility] the surgery that was previously discussed to remove her hardware. He did not specifically recommend suppressive antibiotics but was on board with follow-up after surgery. The patient is nowhere near a decision for surgery in her mind she wants to wait until she is "sick". I warned her that sepsis can sometimes make people very sick or can urgently make people live threateningly ill she expressed understanding and states that she is already been in that situation. We also have had  calls from her insurance company about organizing ongoing home care. Our case management nurse is already reached out to to home health nursing agency without success because of staffing issues. The patient has been on Aquacel Ag rope packing. She feels this needs to be changed twice a day which is another issue. She has not been systemically unwell 12/07/2018; patient returns and states she is in [place] for another month and a half. She is looking for supplies specifically Aquacel Ag. She has not been systemically unwell no fever no chills. She states the wounds are roughly the same, including the amount of drainage. She stated at the end that there may still be some more pain to the right side of the wound areas. Readmission: 11/08/2020 upon evaluation today patient appears to be doing  somewhat poorly here upon readmission with regard to her back ulcer although not as badly as she was in the past. She does have some necrotic tissue noted in the base of the wound and the home health nurse felt that they did need some direction as far as what would be the best way to go forward right now for the patient. She did have hardware removal January 2021. Following this she was given doxycycline eventually which she is actually still on currently until she goes back for her next surgery. She is also on Dilaudid 8 mg four times a day for pain. She had's home health who has been changing the dressing for her they have been using a silver alginate dressing. With that being said she is actually tentatively going to be scheduled for hardware placement and a muscle flap closure once she improved significantly regard to bone density she was given Forteo to try to help build up her bone strength. 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 I do not see any overt infection at this point externally upon evaluation of the wound. There is some necrotic tissue at the base of the wound. 11/22/2020 on evaluation today patient's wound actually appears to be doing about the same at this time. Fortunately there does not appear to be any signs of active infection at this time systemically which is great news. With that being said she did see Dr. Reginold Agent her infectious disease specialist in his opinion was he does not want the wound to close as he does feel that the patient still has osteomyelitis actively present in the bone with a sinus tract extending to the hardware. With that being said her surgeon is putting off the surgery until at least February the trying to improve her osteoporosis state before going in for surgery to give her the best and at most optimal ability  to get this closed and under control. With that being said I do think that with there being an infection active at this point potentially the hyperbarics could potentially improve her overall bone status in order to also help with a more successful surgery in the end. Nonetheless I am not sure the best way to accomplish this from a standpoint of timing and we may need to get in touch with her surgeon in order to coordinate. We did get that information for phone number today as well. Objective Constitutional Well-nourished and well-hydrated in no acute distress. Vitals Time Taken: 10:02 AM, Height: 64 in, Weight: 195 lbs,  BMI: 33.5, Temperature: 98.4 F, Pulse: 81 bpm, Respiratory Rate: 18 breaths/min, Blood Pressure: 144/87 mmHg. Respiratory normal breathing without difficulty. Psychiatric this patient is able to make decisions and demonstrates good insight into disease process. Alert and Oriented x 3. pleasant and cooperative. General Notes: His wound bed actually showed signs of good granulation at this time. There does not appear to be any evidence of worsening active infection which is great news. With that being said she does have obviously a chronic osteomyelitis. With that being said I think the Dakin's keep things clean but not necessarily good to close this wound obviously. The good news is no one wants this closed any way they feel like he needs to stay open until surgery which I completely agree with. Integumentary (Hair, Skin) Wound #2 status is Open. Original cause of wound was Surgical Injury. The wound is located on the Distal Back. The wound measures 2.6cm length x 2.1cm width x 1.6cm depth; 4.288cm^2 area and 6.861cm^3 volume. There is Fat Layer (Subcutaneous Tissue) exposed. There is no tunneling noted, however, there is undermining starting at 4:00 and ending at 6:00 with a maximum distance of 2cm. There is a medium amount of serosanguineous drainage noted. The wound margin  is distinct with the outline attached to the wound base. There is small (1-33%) pink granulation within the wound bed. There is a large (67-100%) amount of necrotic tissue within the wound bed including Adherent Slough. 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 Bathing/ Shower/ Hygiene: May shower with protection but do not get wound dressing(s) wet. Home Health: No change in wound care orders this week; continue Home Health for wound care. May utilize formulary equivalent dressing for wound treatment orders unless otherwise specified. Other Home Health Orders/Instructions: - [facility] WOUND #2: - Back Wound Laterality: Distal Prim Dressing: Dakin's .25% slution moistened gauze (Home Health) 1 x Per Day/30 Days ary Discharge Instructions: pack lightly into woiund Secondary Dressing: ComfortFoam Border, 4x4 in (silicone border) 1 x Per Day/30 Days Discharge Instructions: Apply over primary dressing as directed. 1. I would recommend currently that we going continue with the wound care measures as before and the patient is in agreement with that. 2. I am also can recommend currently that we have the patient continue to try to use this daily I think that is an appropriate regimen. 3. I am going to try to get in touch with her surgeon to discuss the possibility of hyperbaric oxygen therapy to see if they think this could be of benefit in her treatment. We will see patient back for reevaluation in 2 weeks here in the clinic. If anything worsens or changes patient will contact our office for  additional recommendations. Electronic Signature(s) Signed: 11/22/2020 11:31:08 AM By: Lenda Kelp PA-C Entered By: Lenda Kelp on 11/22/2020 11:31:08 -------------------------------------------------------------------------------- SuperBill Details Patient Name: Date of Service: Alison Monroe, Alison A. 11/22/2020 Medical Record Number: [id number] Patient Account Number: [id number] Date of Birth/Sex: Treating RN: June 30, 1961 (59 y.o. 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

## 2020-11-23 NOTE — Progress Notes (Signed)
Alison Monroe ([id number]) Visit Report for 11/22/2020 Arrival Information Details Patient Name: Date of Service: Alison Monroe, Alison Monroe 11/22/2020 9:15 A M Medical Record Number: [id number] Patient Account Number: [id number] Date of Birth/Sex: Treating RN: [birth year] (59 y.o. Ardis Rowan, Lauren Primary Care Jamien Casanova: Orland Mustard Other Clinician: Referring Maloree Uplinger: Treating Jahfari Ambers/Extender: Elvin So in Treatment: 2 Visit Information History Since Last Visit Added or deleted any medications: No Patient Arrived: Dan Humphreys Any new allergies or adverse reactions: No Arrival Time: 10:02 Had a fall or experienced change in No Accompanied By: self activities of daily living that may affect Transfer Assistance: None risk of falls: Patient Identification Verified: Yes Signs or symptoms of abuse/neglect since last visito No Secondary Verification Process Completed: Yes Hospitalized since last visit: No Patient Requires Transmission-Based Precautions: No Implantable device outside of the clinic excluding No Patient Has Alerts: Yes cellular tissue based products placed in the center Patient Alerts: Patient on Blood Thinner since last visit: Has Dressing in Place as Prescribed: Yes Pain Present Now: Yes Electronic Signature(s) Signed: 11/23/2020 3:27:39 PM By: Fonnie Mu RN Entered By: Fonnie Mu on 11/22/2020 10:02:36 -------------------------------------------------------------------------------- Clinic Level of Care Assessment Details Patient Name: Date of Service: [name], [name] 11/22/2020 9:15 A M Medical Record Number: [id number] Patient Account Number: [id number] Date of Birth/Sex: Treating RN: 12-Jul-1961 (59 y.o. Tommye Standard Primary Care Ison Wichmann: Orland Mustard Other Clinician: Referring Quantarius Genrich: Treating Glenola Wheat/Extender: Elvin So in Treatment: 2 Clinic Level of Care Assessment Items TOOL 4  Quantity Score []  - 0 Use when only an EandM is performed on FOLLOW-UP visit ASSESSMENTS - Nursing Assessment / Reassessment X- 1 10 Reassessment of Co-morbidities (includes updates in patient status) X- 1 5 Reassessment of Adherence to Treatment Plan ASSESSMENTS - Wound and Skin A ssessment / Reassessment X - Simple Wound Assessment / Reassessment - one wound 1 5 []  - 0 Complex Wound Assessment / Reassessment - multiple wounds []  - 0 Dermatologic / Skin Assessment (not related to wound area) ASSESSMENTS - Focused Assessment []  - 0 Circumferential Edema Measurements - multi extremities []  - 0 Nutritional Assessment / Counseling / Intervention []  - 0 Lower Extremity Assessment (monofilament, tuning fork, pulses) []  - 0 Peripheral Arterial Disease Assessment (using hand held doppler) ASSESSMENTS - Ostomy and/or Continence Assessment and Care []  - 0 Incontinence Assessment and Management []  - 0 Ostomy Care Assessment and Management (repouching, etc.) PROCESS - Coordination of Care X - Simple Patient / Family Education for ongoing care 1 15 []  - 0 Complex (extensive) Patient / Family Education for ongoing care X- 1 10 Staff obtains , Records, T Results / Process Orders est X- 1 10 Staff telephones HHA, Nursing Homes / Clarify orders / etc []  - 0 Routine Transfer to another Facility (non-emergent condition) []  - 0 Routine Hospital Admission (non-emergent condition) []  - 0 New Admissions / / Ordering NPWT Apligraf, etc. , []  - 0 Emergency Hospital Admission (emergent condition) X- 1 10 Simple Discharge Coordination []  - 0 Complex (extensive) Discharge Coordination PROCESS - Special Needs []  - 0 Pediatric / Minor Patient Management []  - 0 Isolation Patient Management []  - 0 Hearing / Language / Visual special needs []  - 0 Assessment of Community assistance (transportation, D/C planning, etc.) []  - 0 Additional assistance / Altered  mentation []  - 0 Support Surface(s) Assessment (bed, cushion, seat, etc.) INTERVENTIONS - Wound Cleansing / Measurement X - Simple Wound Cleansing - one wound 1 5 []  -  0 Complex Wound Cleansing - multiple wounds X- 1 5 Wound Imaging (photographs - any number of wounds) []  - 0 Wound Tracing (instead of photographs) X- 1 5 Simple Wound Measurement - one wound []  - 0 Complex Wound Measurement - multiple wounds INTERVENTIONS - Wound Dressings X - Small Wound Dressing one or multiple wounds 1 10 []  - 0 Medium Wound Dressing one or multiple wounds []  - 0 Large Wound Dressing one or multiple wounds X- 1 5 Application of Medications - topical []  - 0 Application of Medications - injection INTERVENTIONS - Miscellaneous []  - 0 External ear exam []  - 0 Specimen Collection (cultures, biopsies, blood, body fluids, etc.) []  - 0 Specimen(s) / Culture(s) sent or taken to Lab for analysis []  - 0 Patient Transfer (multiple staff / / Similar devices) []  - 0 Simple Staple / Suture removal (25 or less) []  - 0 Complex Staple / Suture removal (26 or more) []  - 0 Hypo / Hyperglycemic Management (close monitor of Blood Glucose) []  - 0 Ankle / Brachial Index (ABI) - do not check if billed separately X- 1 5 Vital Signs Has the patient been seen at the hospital within the last three years: Yes Total Score: 100 Level Of Care: New/Established - Level 3 Electronic Signature(s) Signed: 11/22/2020 6:12:31 PM By: RN, BSN Entered By: on 11/22/2020 11:16:14 -------------------------------------------------------------------------------- Encounter Discharge Information Details Patient Name: Date of Service: , [name] 11/22/2020 9:15 A M Medical Record Number: Patient Account Number: Date of Birth/Sex: Treating RN: 06/19/1961 (59 y.o. Primary Care Jakai Onofre: Other Clinician: Referring  Saliah Crisp: Treating Tejay Hubert/Extender: in Treatment: 2 Encounter Discharge Information Items Discharge Condition: Stable Ambulatory Status: Walker Discharge Destination: Home Transportation: Private Auto Accompanied By: self Schedule Follow-up Appointment: Yes Clinical Summary of Care: Patient Declined Electronic Signature(s) Signed: 11/22/2020 5:41:55 PM By: Zenaida Deed RN Entered By: Zenaida Deed on 11/22/2020 11:39:58 -------------------------------------------------------------------------------- Lower Extremity Assessment Details Patient Name: Date of Service: TYNIYA, KUYPER A. 11/22/2020 9:15 A M Medical Record Number: [id number] Patient Account Number: [id number] Date of Birth/Sex: Treating RN: 1961-07-02 (59 y.o. Freddy Finner, Lauren Primary Care Camiya Vinal: Orland Mustard Other Clinician: Referring Aylssa Herrig: Treating Kanija Remmel/Extender: Elvin So in Treatment: 2 Electronic Signature(s) Signed: 11/23/2020 3:27:39 PM By: Yevonne Pax RN Entered By: Yevonne Pax on 11/22/2020 10:03:24 -------------------------------------------------------------------------------- Multi-Disciplinary Care Plan Details Patient Name: Date of Service: Charlena Cross, Josi A. 11/22/2020 9:15 A M Medical Record Number: [id number] Patient Account Number: [id number] Date of Birth/Sex: Treating RN: Jun 29, 1961 (59 y.o. Ardis Rowan Primary Care Myosha Cuadras: Orland Mustard Other Clinician: Referring Khaiden Segreto: Treating Edwing Figley/Extender: Elvin So in Treatment: 2 Active Inactive Abuse / Safety / Falls / Self Care Management Nursing Diagnoses: Potential for falls Goals: Patient/caregiver will verbalize understanding of skin care regimen Date Initiated: 11/08/2020 Target Resolution Date: 12/06/2020 Goal Status: Active Patient/caregiver will verbalize/demonstrate measures taken to prevent injury and/or  falls Date Initiated: 11/08/2020 Target Resolution Date: 12/06/2020 Goal Status: Active Interventions: Assess fall risk on admission and as needed Assess impairment of mobility on admission and as needed per policy Notes: Wound/Skin Impairment Nursing Diagnoses: Impaired tissue integrity Knowledge deficit related to ulceration/compromised skin integrity Goals: Patient/caregiver will verbalize understanding of skin care regimen Date Initiated: 11/08/2020 Target Resolution Date: 12/06/2020 Goal Status: Active Ulcer/skin breakdown will have a volume reduction of 30% by week 4 Date Initiated: 11/08/2020 Target Resolution Date:  12/06/2020 Goal Status: Active Interventions: Assess patient/caregiver ability to obtain necessary supplies Assess patient/caregiver ability to perform ulcer/skin care regimen upon admission and as needed Assess ulceration(s) every visit Provide education on ulcer and skin care Treatment Activities: Skin care regimen initiated : 11/08/2020 Topical wound management initiated : 11/08/2020 Notes: Electronic Signature(s) Signed: 11/22/2020 6:12:31 PM By: Zenaida DeedBoehlein, Linda RN, BSN Entered By: Zenaida DeedBoehlein, Linda on 11/22/2020 11:13:39 -------------------------------------------------------------------------------- Pain Assessment Details Patient Name: Date of Service: Leola BrazilSTRA USS, Dani A. 11/22/2020 9:15 A M Medical Record Number: [id number] Patient Account Number: [id number] Date of Birth/Sex: Treating RN: Feb 12, 1961 (59 y.o. Ardis RowanF) Breedlove, Lauren Primary Care Camey Edell: Orland MustardWolfe, Allison Other Clinician: Referring Dagny Fiorentino: Treating Gearold Wainer/Extender: Elvin SoStone III, Hoyt Wolfe, Allison Weeks in Treatment: 2 Active Problems Location of Pain Severity and Description of Pain Patient Has Paino Yes Site Locations Pain Location: Pain in Ulcers With Dressing Change: Yes Duration of the Pain. Constant / Intermittento Constant Rate the pain. Current Pain Level: 6 Worst  Pain Level: 10 Least Pain Level: 0 Tolerable Pain Level: 7 Character of Pain Describe the Pain: Aching Pain Management and Medication Current Pain Management: Medication: Yes Cold Application: No Rest: Yes Massage: No Activity: No T.E.N.S.: No Heat Application: No Leg drop or elevation: No Is the Current Pain Management Adequate: Adequate How does your wound impact your activities of daily livingo Sleep: No Bathing: No Appetite: No Relationship With Others: No Bladder Continence: No Emotions: No Bowel Continence: No Work: No Toileting: No Drive: No Dressing: No Hobbies: No Electronic Signature(s) Signed: 11/23/2020 3:27:39 PM By: Fonnie MuBreedlove, Lauren RN Entered By: Fonnie MuBreedlove, Lauren on 11/22/2020 10:03:14 -------------------------------------------------------------------------------- Patient/Caregiver Education Details Patient Name: Date of Service: Domingo PulseSTRA USS, Lochlyn A. 12/8/2021andnbsp9:15 A M Medical Record Number: [id number] Patient Account Number: [id number] Date of Birth/Gender: Treating RN: Feb 12, 1961 (59 y.o. Tommye StandardF) Boehlein, Linda Primary Care Physician: Orland MustardWolfe, Allison Other Clinician: Referring Physician: Treating Physician/Extender: Elvin SoStone III, Hoyt Wolfe, Allison Weeks in Treatment: 2 Education Assessment Education Provided To: Patient Education Topics Provided Infection: Methods: Explain/Verbal Responses: Reinforcements needed, State content correctly Wound/Skin Impairment: Methods: Explain/Verbal Responses: Reinforcements needed, State content correctly Electronic Signature(s) Signed: 11/22/2020 6:12:31 PM By: Zenaida DeedBoehlein, Linda RN, BSN Entered By: Zenaida DeedBoehlein, Linda on 11/22/2020 11:14:16 -------------------------------------------------------------------------------- Wound Assessment Details Patient Name: Date of Service: Leola BrazilSTRA USS, Barb A. 11/22/2020 9:15 A M Medical Record Number: [id number] Patient Account Number: [id number] Date of Birth/Sex: Treating  RN: Feb 12, 1961 (59 y.o. Ardis RowanF) Breedlove, Lauren Primary Care Shawnette Augello: Orland MustardWolfe, Allison Other Clinician: Referring Shera Laubach: Treating Daleah Coulson/Extender: Elvin SoStone III, Hoyt Wolfe, Allison Weeks in Treatment: 2 Wound Status Wound Number: 2 Primary Etiology: Open Surgical Wound Wound Location: Distal Back Wound Status: Open Wounding Event: Surgical Injury Comorbid Cataracts, Anemia, End Stage Renal Disease, History: Neuropathy Date Acquired: 12/31/2019 Weeks Of Treatment: 2 Clustered Wound: No Wound Measurements Length: (cm) 2.6 Width: (cm) 2.1 Depth: (cm) 1.6 Area: (cm) 4.288 Volume: (cm) 6.861 % Reduction in Area: 27% % Reduction in Volume: 16.6% Epithelialization: None Tunneling: No Undermining: Yes Starting Position (o'clock): 4 Ending Position (o'clock): 6 Maximum Distance: (cm) 2 Wound Description Classification: Full Thickness Without Exposed Support Structures Wound Margin: Distinct, outline attached Exudate Amount: Medium Exudate Type: Serosanguineous Exudate Color: red, brown Foul Odor After Cleansing: No Slough/Fibrino Yes Wound Bed Granulation Amount: Small (1-33%) Exposed Structure Granulation Quality: Pink Fascia Exposed: No Necrotic Amount: Large (67-100%) Fat Layer (Subcutaneous Tissue) Exposed: Yes Necrotic Quality: Adherent Slough Tendon Exposed: No Muscle Exposed: No Joint Exposed: No Bone Exposed: No Treatment Notes Wound #2 (Back) Wound Laterality: Distal Cleanser Peri-Wound Care  Topical Primary Dressing Dakin's .25% slution moistened gauze Discharge Instruction: pack lightly into woiund Secondary Dressing ComfortFoam Border, 4x4 in (silicone border) Discharge Instruction: Apply over primary dressing as directed. Secured With Compression Wrap Compression Stockings Facilities manager) Signed: 11/23/2020 3:27:39 PM By: Fonnie Mu RN Entered By: Fonnie Mu on 11/22/2020  10:09:29 -------------------------------------------------------------------------------- Vitals Details Patient Name: Date of Service: [name], Shequilla A. 11/22/2020 9:15 A M Medical Record Number: [id number] Patient Account Number: [id number] Date of Birth/Sex: Treating RN: 1961/06/08 (59 y.o. Ardis Rowan, Lauren Primary Care Amarys Sliwinski: Orland Mustard Other Clinician: Referring Roscoe Witts: Treating Haydn Cush/Extender: Elvin So in Treatment: 2 Vital Signs Time Taken: 10:02 Temperature (F): 98.4 Height (in): 64 Pulse (bpm): 81 Weight (lbs): 195 Respiratory Rate (breaths/min): 18 Body Mass Index (BMI): 33.5 Blood Pressure (mmHg): 144/87 Reference Range: 80 - 120 mg / dl Electronic Signature(s) Signed: 11/23/2020 3:27:39 PM By: Fonnie Mu RN Entered By: Fonnie Mu on 11/22/2020 10:04:00

## 2020-11-30 ENCOUNTER — Telehealth: Payer: Self-pay | Admitting: Internal Medicine

## 2020-11-30 ENCOUNTER — Ambulatory Visit (INDEPENDENT_AMBULATORY_CARE_PROVIDER_SITE_OTHER): Payer: Medicare Other

## 2020-11-30 ENCOUNTER — Other Ambulatory Visit: Payer: Self-pay

## 2020-11-30 DIAGNOSIS — M810 Age-related osteoporosis without current pathological fracture: Secondary | ICD-10-CM | POA: Diagnosis not present

## 2020-11-30 MED ORDER — DENOSUMAB 60 MG/ML ~~LOC~~ SOSY
60.0000 mg | PREFILLED_SYRINGE | Freq: Once | SUBCUTANEOUS | Status: AC
  Administered 2020-11-30: 60 mg via SUBCUTANEOUS

## 2020-11-30 NOTE — Telephone Encounter (Signed)
Patient is calling in to find out if we have a copy of her 1099 tax form. She states she turned it in to [place] when we sent off information or application to get her eloquis cheaper. She is not sure where the application is sent too but she claims it makes the eloquis free. She states that she usually gives all the information required to Dr. Blanchie Dessert nurse and she sends in the application. She just needs a copy of her 1099 to complete the new set of forms. Please advise  Thank you!

## 2020-11-30 NOTE — Telephone Encounter (Signed)
Returned call to patient. Explained that her patient assistance application is scanned to chart but generally personal financial statements, bank statements, social security statements are not scanned in. Reviewed what was scanned in 2021 for her eliquis application. Reviewed the requirements for applications and OOP expenses. Suggested she try to get another refill before end of 2021 from assistance program

## 2020-11-30 NOTE — Progress Notes (Signed)
Prolia injection administered. Patient tolerated well.  

## 2020-12-01 ENCOUNTER — Telehealth: Payer: Self-pay | Admitting: *Deleted

## 2020-12-01 NOTE — Telephone Encounter (Signed)
[facility] form completed  Sent/faxed --[phone].

## 2020-12-04 ENCOUNTER — Telehealth: Payer: Self-pay

## 2020-12-04 NOTE — Telephone Encounter (Signed)
I spoke to the patient today about her pain management referral and explained to her that we need chart notes about her pain to send to new pain management doctor-patient has agreed to drop some of the records so I can fax them to [facility]

## 2020-12-06 ENCOUNTER — Encounter (HOSPITAL_BASED_OUTPATIENT_CLINIC_OR_DEPARTMENT_OTHER): Payer: Medicare Other | Admitting: Physician Assistant

## 2020-12-13 ENCOUNTER — Telehealth: Payer: Self-pay

## 2020-12-13 NOTE — Telephone Encounter (Signed)
Inbound [facility] from [facility] requesting a call to confirm Rx and refills. Called [phone] and spoke with Renea Ee to provider Rx sig and refills for Forteo.

## 2020-12-14 ENCOUNTER — Telehealth: Payer: Self-pay

## 2020-12-14 ENCOUNTER — Other Ambulatory Visit: Payer: Self-pay | Admitting: Physician Assistant

## 2020-12-14 MED ORDER — METOPROLOL SUCCINATE ER 25 MG PO TB24: 12.5000 mg | ORAL_TABLET | Freq: Two times a day (BID) | ORAL | 3 refills | Status: DC

## 2020-12-14 NOTE — Telephone Encounter (Addendum)
Left voice message for patient to give me a call back for her cardiac monitor results.   ----- Message from [place], [place] sent at 12/13/2020  4:32 PM EST ----- No afib was seen, Alison Monroe does have some bursts of fast beat, none of which last very long. Will discuss further on follow up

## 2020-12-17 ENCOUNTER — Encounter: Payer: Self-pay | Admitting: Family Medicine

## 2020-12-17 NOTE — Progress Notes (Signed)
Cardiology Office Note:    Date:  12/25/2020   ID:  Alison Monroe, DOB 1961-06-24, MRN [id number]  PCP:  Orland Mustard, MD  Cardiologist:  Chrystie Nose, MD  Electrophysiologist:  None   Referring MD: Orland Mustard, MD   Chief Complaint: follow-up of palpitations   History of Present Illness:    Alison Monroe is a 61 y.o. female with a history of paroxysmal atrial fibrillation on Eliquis, right atrial thrombus, chronic lower extremity edema on Torsemide, type 2 diabetes mellitus, and CKD stage III who is followed by Dr. Rennis Golden and presents today for follow-up of palpitations.  Patient has previously received care in [place], [place] (where she primarily resides), [facility], and [employer] for long-standing issues related to a spine injury secondary to a fall several years ago. 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 This required removal of the hardware and antibiotic therapy. During that admission, Echo showed 2x2 cm mass in the right atrium concerning for possible thrombus. A cardiac MRI was performed which confirmed this and it was noted to be at the diaphragmatic portion of the right atrium adjacent to the IVC and in close proximity to the tricuspid valve.  It was not noted to extend in the SVC or IVC. Bilateral upper and lower extremity dopplers were negative for thrombus. She was noted to have some paroxysmal atrial fibrillation during that admission and was started on Eliquis 5mg  twice daily. She was referred to Dr. Rennis Golden after this hospitalization for further management of atrial fibrillation and right atrial thrombus. Repeat Echo in 05/2020 showed no evidence of right atrial mass.  Patient was last seen by Azalee Course, PA-C, on 11/17/2020 at which time she reported some palpitations after staring Forteo for her severe osteoporosis. 2 Week Zio Monitor was ordered and  showed no atrial fibrillation but did show a short run of non-sustained VT lasting 9 beats as well as 50 runs of SVT with longest run lasting 11.7 seconds.   Patient presents today for follow-up. Here alone. Discussed monitor results. She continues to have palpitations occasionally. Sometimes they are brief and sometimes they are prolonged lasting up to 15 minutes. She is very surprised that the monitor did not pick up one of these prolonged episodes as she has about 1 of them per week. She does have an Apple watch and states that during these prolonged episodes, the EKG function on her watch shows atrial fibrillation. She states she feels "very bad" during prolonged episodes and has to lay down. She notes occasional lightheadedness/dizziness but not always related to palpitations. No syncope. She was unable to find any of these strips to show me today. No chest pain. She has some shortness of breath when walking with a cane but not when using rollator walker. No orthopnea or PND. She has lymphedema and has chronic lower extremity edema.   We had a long discussion about medications for her palpitations. She is currently on Toprol-XL 12.5mg  twice daily. She is very hesitant to increase this. She states while hospitalized at [facility] she was started on Metoprolol 50mg  twice day and she had a significant drop in diastolic BP (as low as the 30's). It sounds like her systolic BP was OK. Metoprolol was increased to 25mg  twice daily and same drop in BP occurred. It sounds like she was asymptomatic with this but she is very concerned about this.  She still has an open spinal wound (followed by wound clinic) and is going to need another surgery to restabalize spine and close wound. Timing unclear but will likely be in the next few months. Her Neurosurgeon is apparently leaving [facility] and she is going to have to see someone else.  Past Medical History:  Diagnosis Date  . Arthritis   . Asthma   . Atrial thrombus  05/26/2020   Resolved by echo 2021; lifelong anticoagulation  . Chronic kidney disease    Stage 3  . Diabetes (HCC)   . Heart murmur     Past Surgical History:  Procedure Laterality Date  . ANKLE SURGERY Left   . BREAST BIOPSY    . CATARACT EXTRACTION  2015  . GALLBLADDER SURGERY  1981  . GASTRIC BYPASS    . LUMBAR SPINE SURGERY     Several. Rods, bolts and screws in place  . TOTAL KNEE ARTHROPLASTY Right 2015    Current Medications: Current Meds  Medication Sig  . acetaminophen (TYLENOL) 500 MG tablet Take 1,000 mg by mouth 2 (two) times daily as needed.  Marland Kitchen apixaban (ELIQUIS) 5 MG TABS tablet Take 1 tablet (5 mg total) by mouth 2 (two) times daily.  . busPIRone (BUSPAR) 7.5 MG tablet Take 1 tablet (7.5 mg total) by mouth 3 (three) times daily.  . Cholecalciferol 25 MCG (1000 UT) capsule Take 6 capsules (6,000 Units total) by mouth daily.  . cyanocobalamin (,VITAMIN B-12,) 1000 MCG/ML injection Inject 1 mL into the skin every 30 (thirty) days.   . cyclobenzaprine (FLEXERIL) 10 MG tablet Take 1 tablet (10 mg total) by mouth 3 (three) times daily as needed for muscle spasms.  . diclofenac Sodium (VOLTAREN) 1 % GEL Apply 2 g topically in the morning and at bedtime.   Marland Kitchen diltiazem (CARDIZEM CD) 120 MG 24 hr capsule Take 1 capsule (120 mg total) by mouth daily.  . diphenhydrAMINE (BENADRYL) 50 MG capsule Take 50 mg by mouth as needed for allergies or sleep.   Marland Kitchen doxycycline (VIBRAMYCIN) 100 MG capsule Take 100 mg by mouth 2 (two) times daily.  Marland Kitchen escitalopram (LEXAPRO) 10 MG tablet Take 20 mg by mouth daily.  . fentaNYL (DURAGESIC - DOSED MCG/HR) 100 MCG/HR Place 1 patch onto the skin every other day.  Marland Kitchen HYDROmorphone (DILAUDID) 8 MG tablet Take 1 tablet by mouth every 6 (six) hours.   . Multiple Vitamin (MULTIVITAMIN) tablet Take 1 tablet by mouth daily.  Marland Kitchen omeprazole (PRILOSEC) 20 MG capsule Take 1 capsule by mouth in the morning, at noon, and at bedtime.   . Teriparatide, Recombinant,  (FORTEO) 620 MCG/2.48ML SOPN Inject 20 mcg into the skin daily.  Marland Kitchen torsemide (DEMADEX) 20 MG tablet Take 0.5 tablets (10 mg total) by mouth daily.  . [DISCONTINUED] metoprolol succinate (TOPROL-XL) 25 MG 24 hr tablet Take 0.5 tablets (12.5 mg total) by mouth 2 (two) times daily.     Allergies:   Erythromycin, Erythromycin base, Other, Penicillins, Vancomycin, Adhesive  [tape], and Ampicillin   Social History   Socioeconomic History  . Marital status: Single    Spouse name: Not on file  . Number of children: 0  . Years of education: Not on file  . Highest education level: Not on file  Occupational History  . Occupation: disabled  Tobacco Use  . Smoking status: Never Smoker  . Smokeless tobacco: Never Used  Vaping Use  . Vaping Use: Never used  Substance and Sexual Activity  . Alcohol use:  Never  . Drug use: Never  . Sexual activity: Not on file  Other Topics Concern  . Not on file  Social History Narrative   Living with her brother and his family (strained relationship)    Previously living in [place] prior to last back surgery, plans to return to [place] once she is able to care for herself better    Social Determinants of Corporate investment banker Strain: Not on file  Food Insecurity: Not on file  Transportation Needs: Not on file  Physical Activity: Not on file  Stress: Not on file  Social Connections: Not on file     Family History: The patient's family history includes Arthritis in her maternal grandmother and mother; Asthma in her maternal grandmother and mother; COPD in her father and mother; Cancer in her maternal grandfather; Depression in her mother; Diabetes in her brother, father, maternal grandmother, and mother; Early death in her father and mother; Heart attack in her father and mother; Heart disease in her brother and mother; Hyperlipidemia in her brother and mother; Hypertension in her brother, father, and mother; Kidney disease in her father;  Stroke in her father. There is no history of Osteoporosis.  ROS:   Please see the history of present illness.     EKGs/Labs/Other Studies Reviewed:    The following studies were reviewed today:  Echocardiogram 02/01/2020 (at [facility]): Interpretation: - Normal LV function with moderate LVH. - Normal RV systolic function. - Valvular regurgitation: trivial MR, trivial PR, mild TR. - No valvular stenosis.  - Irregular rhythm during exam. - 2x2 cm echogenic mass in RA. Recommend further dedicated cardiac imaging to delineate attachment. - No prior study for comparison.  _______________  Cardiac MRI 02/03/2020: Findings: 1. The left ventricle is normal in cavity size and wall thickness. Global LV systolic function is normal  with an LV ejection fraction calculated at 66%. There are no regional wall motion abnormalities.  2. The right ventricle is normal in cavity size, wall thickness, and systolic function. Global RV systolic  function is normal with an RV ejection fraction calculated at 61%.  3. The left atrium is normal in size.  4. The right atrium is normal in size. There is a large mass (24 x23 x 14 mm) in the diaphragmatic portion  of the right atrium, adjacent to the inferior vena cava and in close proximity to the tricuspid valve. The  mass does not extend into the SVC or IVC. Tissue characteristics on SSFP, HASTE, and delayed enhancement imaging show the masses are not composed of fat and are not calcified. On first pass perfusion there is no appreciable contrast uptake, and on delayed enhancement imaging there is no appreciable contrast uptake, suggesting that the structure is not vascular. These findings suggest that the mass is consistent with thrombus. Also, included in the differential are myxomas composed mostly of an overlying layer of thrombus, although this is less likely.  5. The aortic valve is trileaflet in morphology. There is no significant aortic valve stenosis or   regurgitation. There is no significant valvular disease.  6. There is a small part of the papillary muscle/ chordae attached directly to the posterior mitral valve  annulus. The mitral valve annulargeometry and motion are normal. There is trivial mitral valve  regurgitation with no stenosis.  7. There is trivial tricuspid regurgitation. There is mild pulmonic regurgitation.  8. Delayed enhancement imaging demonstrates no evidence of myocardialinfarction, scaring or infiltrative  disease.  9. The pericardium  is normal. There is no significant pericardial effusion.   Conclusion: Cardiac MRI findings suggest that the mass is consistent with right atrial thrombus.  _______________  Echocardiogram 05/22/2020: Impressions: 1. Left ventricular ejection fraction, by estimation, is 60 to 65%. The  left ventricle has normal function. The left ventricle has no regional  wall motion abnormalities. Left ventricular diastolic parameters were  normal.  2. Right ventricular systolic function is normal. The right ventricular  size is normal.  3. The mitral valve is normal in structure. No evidence of mitral valve  regurgitation. No evidence of mitral stenosis.  4. The aortic valve is normal in structure. Aortic valve regurgitation is  not visualized. No aortic stenosis is present.  _______________  2 Week Zio Monitor 11/2020: Predominant underlying rhythm was Sinus Rhythm. 1 run of Ventricular Tachycardia occurred lasting 9 beats with a max rate of 167 bpm (avg 144 bpm). 50 Supraventricular Tachycardia runs occurred, the run with the fastest interval lasting 4 beats with a max rate of 174 bpm, the longest lasting 11.7 secs with an avg rate of 130 bpm.  EKG:  EKG not ordered today.   Recent Labs: 11/16/2020: ALT 22; BUN 20; Creat 1.54; Hemoglobin 12.2; Platelets 316; Potassium 4.5; Sodium 141; TSH 1.69  Recent Lipid Panel    Component Value Date/Time   CHOL 182 01/12/2019 1102   TRIG 64.0  01/12/2019 1102   HDL 56.10 01/12/2019 1102   CHOLHDL 3 01/12/2019 1102   VLDL 12.8 01/12/2019 1102   LDLCALC 113 (H) 01/12/2019 1102    Physical Exam:    Vital Signs: BP (!) 163/90   Pulse 76   Ht 5\' 4"  (1.626 m)   SpO2 96%   BMI 37.21 kg/m   Patient declined weight today. She reported she was unable to step onto scale due to back problems.  Wt Readings from Last 3 Encounters:  11/16/20 216 lb 12.8 oz (98.3 kg)  10/30/20 219 lb (99.3 kg)  10/03/20 211 lb (95.7 kg)     General: 60 y.o. female in no acute distress. HEENT: Normocephalic and atraumatic. Sclera clear. EOMs intact. Neck: Supple. Sof right carotid bruit. No JVD. Heart: RRR. Distinct S1 and S2. Soft systolic murmur noted. No gallops or rubs. Radial pulses 2+ and equal bilaterally. Lungs: No increased work of breathing. Clear to ausculation bilaterally. No wheezes, rhonchi, or rales.  Abdomen: Soft, non-distended, and non-tender to palpation.   MSK: Back brace in place. Ambulating with rollator walker. Extremities: Mild pitting edema of bilateral lower extremities edema.    Skin: Warm and dry. Neuro: Alert and oriented x3. No focal deficits. Psych: Normal affect. Responds appropriately.  Assessment:    1. Palpitations   2. Paroxysmal atrial fibrillation (HCC)   3. NSVT (nonsustained ventricular tachycardia) (HCC)   4. Paroxysmal SVT (supraventricular tachycardia) (HCC)   5. Carotid bruit, unspecified laterality   6. Right atrial thrombus   7. Primary hypertension   8. Type 2 diabetes mellitus with complication, without long-term current use of insulin (HCC)   9. Stage 3b chronic kidney disease (HCC)     Plan:    Palpitations Paroxysmal Atrial Fibrillation Non-Sustained VT and Paroxysmal SVT Recent Zio Monitor in 11/2020 showed no atrial fibrillation but did show 1 short run of non-sustained VT lasting 9 beats as well as 50 runs of SVT with longest run lasting 11.7 seconds. She continues to have report  occasional palpitations sometimes lasting up to 15 minutes. She is very surprised that her monitor did  not pick up more prolonged episodes as she states she has these at least once per weak. She feels "very bad" during these prolonged episodes and she is very concerned about them. Patient does drink 3 caffeine beverages a day (coffee in the morning and then 2 sodas throughout the day. Recommended decreasing caffeine intake. I had a very long conversation with patient where we discussed different medication options. She is currently on Toprol-XL 12.5mg  twice daily. First discussed that we could increase Toprol-XL to 25mg  twice daily. She states she was previously on this dose while hospitalized at [facility] and it significantly dropped her diastolic BP to as low as the 30's (sounds like systolic BP was OK). She reportedly felt fine with this but is very concerned about her BP "bottoming out." Therefore, discussed slow increase to 25mg  in the AM and continuing 12.5mg  in the PM or prescribing PRN Lopressor 12.5mg  for patient to take as needed for palpitations in addition to current dose of Toprol-XL. Patient was again very hesitant about this. Finally, discussed completely stopping Toprol-XL and trying Cardizem CD 120mg  daily instead to see if that works better without dropping her BP. Patient has a home health RN coming tomorrow afternoon so she can take the first dose tomorrow morning and then RN can check her BP in the afternoon and make sure it is not too low. Patient was the most comfortably with this idea so we will try this. Continue Eliquis 5mg  twice daily for atrial fibrillation. She returned completed patient assistant forms for Eliquis today.  History of Right Atrial Thrombus - Diagnosed during hospitalization at [facility] in January/February 2021.  - Repeat Echo in 05/2020 showed no evidence of right atrial mass. - Continue Eliquis as above.   Carotid Bruit - Soft right carotid bruit noted on exam. Possible  radiation from cardiac murmur.  - Will check carotid dopplers.    Chronic Lower Extremity Edema - Stable.  - Continue Torsemide 10mg  daily.  Hypertension - BP elevated at 163/90 in the office. She states is it usually well controlled at home. She has a Charity fundraiser who comes and checks her BP once per week.  Sounds like systolic BP usually in the 120's to 130's (occasionally 140's).  - Stop Toprol-XL and starting Cardizem as above.  - Patient will keep BP/HR log for 2 weeks and then send [place] these to review. If BP consistently elevated, can consider increasing Cardizem.  Type 2 Diabetes Mellitus - Managed by PCP.  CKD Stage III - Most recent creatinine 1.54 on 11/16/2020. Baseline creatinine around 1.4 to 1.6. - Followed by PCP.  Pre-Op Assessment - Patient will need another back surgery to estabalize spine and close wound. Timing unclear but will likely be in the next several months. She is about to go back to [place] for a couple of months to sell her place there.  - At patient's last visit with Dr. Rennis Golden in 05/2020, Dr. Rennis Golden stated "At this point she would be at acceptable risk to undergo surgery and it would be okay for her to hold her anticoagulation however she tells me today that she has significant osteopenia or osteoporosis and that the surgeons wish to wait 6 months to a year on therapy to see if she has improvement in her bone density." - We have arranged follow-up with Dr. Rennis Golden in March. It does not sound like surgery will be done before then. However, if so, I think she is still at acceptable risk from a cardiac stable. She  is not very active at this time given back issues. However, she has no angina or acute CHF symptoms. Per Revised Cardiac Risk Index, considered low risk. She likely still warrants a call from pre-op team though just to make sure she is still stable and has had no worsening palpitations with dizziness after medication change or progressive dyspnea.  Disposition: Follow up  in 2 months with Dr. Rennis Golden.   Medication Adjustments/Labs and Tests Ordered: Current medicines are reviewed at length with the patient today.  Concerns regarding medicines are outlined above.  Orders Placed This Encounter  Procedures  . VAS US CAROTID   Meds ordered this encounter  Medications  . diltiazem (CARDIZEM CD) 120 MG 24 hr capsule    Sig: Take 1 capsule (120 mg total) by mouth daily.    Dispense:  30 capsule    Refill:  2    Patient Instructions  Medication Instructions:  STOP metoprolol START Cardizem CD 120 mg by mouth once daily *If you need a refill on your cardiac medications before your next appointment, please call your pharmacy*   Lab Work: None ordered If you have labs (blood work) drawn today and your tests are completely normal, you will receive your results only by: Marland Kitchen MyChart Message (if you have MyChart) OR . A paper copy in the mail If you have any lab test that is abnormal or we need to change your treatment, we will call you to review the results.   Testing/Procedures: Your physician has requested that you have a carotid duplex within 2 months. This test is an ultrasound of the carotid arteries in your neck. It looks at blood flow through these arteries that supply the brain with blood. Allow one hour for this exam. There are no restrictions or special instructions.    Follow-Up: At [facility], you and your health needs are our priority.  As part of our continuing mission to provide you with exceptional heart care, we have created designated Provider Care Teams.  These Care Teams include your primary Cardiologist (physician) and Advanced Practice Providers (APPs -  Physician Assistants and Nurse Practitioners) who all work together to provide you with the care you need, when you need it.  We recommend signing up for the patient portal called "MyChart".  Sign up information is provided on this After Visit Summary.  MyChart is used to connect with  patients for Virtual Visits (Telemedicine).  Patients are able to view lab/test results, encounter notes, upcoming appointments, etc.  Non-urgent messages can be sent to your provider as well.   To learn more about what you can do with MyChart, go to [email or web address].    Your next appointment:   2 month(s)  The format for your next appointment:   In Person  Provider:   K. Italy Hilty, MD   Other Instructions Maintain a log of your blood pressure and heart rate and bring that with you to your follow up visit  Reduce your caffeine intake     Signed, Smitty Knudsen  12/25/2020 7:21 PM    [facility]

## 2020-12-20 ENCOUNTER — Encounter (HOSPITAL_BASED_OUTPATIENT_CLINIC_OR_DEPARTMENT_OTHER): Payer: Medicare Other | Admitting: Physician Assistant

## 2020-12-22 ENCOUNTER — Encounter: Payer: Self-pay | Admitting: Endocrinology

## 2020-12-22 ENCOUNTER — Telehealth: Payer: Self-pay

## 2020-12-22 MED ORDER — BUSPIRONE HCL 7.5 MG PO TABS: 7.5000 mg | ORAL_TABLET | Freq: Three times a day (TID) | ORAL | 0 refills | Status: DC

## 2020-12-22 NOTE — Telephone Encounter (Signed)
  LAST APPOINTMENT DATE: 11/20/2020   NEXT APPOINTMENT DATE:@3 /01/2021  MEDICATION: BUSPAR    PHARMACY:[facility] [address] [place] - [address]

## 2020-12-25 ENCOUNTER — Encounter: Payer: Self-pay | Admitting: Family Medicine

## 2020-12-25 ENCOUNTER — Other Ambulatory Visit: Payer: Self-pay | Admitting: Endocrinology

## 2020-12-25 ENCOUNTER — Ambulatory Visit: Payer: Medicare Other | Admitting: Student

## 2020-12-25 ENCOUNTER — Other Ambulatory Visit: Payer: Self-pay

## 2020-12-25 ENCOUNTER — Encounter: Payer: Self-pay | Admitting: Student

## 2020-12-25 VITALS — BP 163/90 | HR 76 | Ht 64.0 in

## 2020-12-25 DIAGNOSIS — I471 Supraventricular tachycardia: Secondary | ICD-10-CM | POA: Diagnosis not present

## 2020-12-25 DIAGNOSIS — I472 Ventricular tachycardia: Secondary | ICD-10-CM

## 2020-12-25 DIAGNOSIS — I480 Paroxysmal atrial fibrillation: Secondary | ICD-10-CM | POA: Diagnosis not present

## 2020-12-25 DIAGNOSIS — E118 Type 2 diabetes mellitus with unspecified complications: Secondary | ICD-10-CM

## 2020-12-25 DIAGNOSIS — I513 Intracardiac thrombosis, not elsewhere classified: Secondary | ICD-10-CM

## 2020-12-25 DIAGNOSIS — R002 Palpitations: Secondary | ICD-10-CM | POA: Diagnosis not present

## 2020-12-25 DIAGNOSIS — I10 Essential (primary) hypertension: Secondary | ICD-10-CM

## 2020-12-25 DIAGNOSIS — R0989 Other specified symptoms and signs involving the circulatory and respiratory systems: Secondary | ICD-10-CM

## 2020-12-25 DIAGNOSIS — I4729 Other ventricular tachycardia: Secondary | ICD-10-CM

## 2020-12-25 DIAGNOSIS — N1832 Chronic kidney disease, stage 3b: Secondary | ICD-10-CM

## 2020-12-25 MED ORDER — DILTIAZEM HCL ER COATED BEADS 120 MG PO CP24: 120.0000 mg | ORAL_CAPSULE | Freq: Every day | ORAL | 2 refills | Status: DC

## 2020-12-25 NOTE — Telephone Encounter (Signed)
Please refill prn 

## 2020-12-25 NOTE — Telephone Encounter (Signed)
Patient called to request a new RX with refills for Teriparatide, Recombinant, (FORTEO) 620 MCG/2.48ML SOPN AND BD Nano Ultra Fine 50mm x 32G Pen Needles be sent to [facility] ph# [phone] (Patient does not have fax#)

## 2020-12-25 NOTE — Telephone Encounter (Signed)
Pt requesting RF for Forteo and pen needles 32G x . Medications pending. Next scheduled appt is 01/04/21

## 2020-12-25 NOTE — Patient Instructions (Signed)
Medication Instructions:  STOP metoprolol START Cardizem CD 120 mg by mouth once daily *If you need a refill on your cardiac medications before your next appointment, please call your pharmacy*   Lab Work: None ordered If you have labs (blood work) drawn today and your tests are completely normal, you will receive your results only by: Marland Kitchen MyChart Message (if you have MyChart) OR . A paper copy in the mail If you have any lab test that is abnormal or we need to change your treatment, we will call you to review the results.   Testing/Procedures: Your physician has requested that you have a carotid duplex within 2 months. This test is an ultrasound of the carotid arteries in your neck. It looks at blood flow through these arteries that supply the brain with blood. Allow one hour for this exam. There are no restrictions or special instructions.    Follow-Up: At [facility], you and your health needs are our priority.  As part of our continuing mission to provide you with exceptional heart care, we have created designated Provider Care Teams.  These Care Teams include your primary Cardiologist (physician) and Advanced Practice Providers (APPs -  Physician Assistants and Nurse Practitioners) who all work together to provide you with the care you need, when you need it.  We recommend signing up for the patient portal called "MyChart".  Sign up information is provided on this After Visit Summary.  MyChart is used to connect with patients for Virtual Visits (Telemedicine).  Patients are able to view lab/test results, encounter notes, upcoming appointments, etc.  Non-urgent messages can be sent to your provider as well.   To learn more about what you can do with MyChart, go to [email or web address].    Your next appointment:   2 month(s)  The format for your next appointment:   In Person  Provider:   K. Italy Hilty, MD   Other Instructions Maintain a log of your blood pressure and heart  rate and bring that with you to your follow up visit  Reduce your caffeine intake

## 2020-12-26 ENCOUNTER — Encounter: Payer: Self-pay | Admitting: Family Medicine

## 2020-12-26 MED ORDER — INSULIN PEN NEEDLE 32G X 4 MM MISC: 0 refills | Status: AC

## 2020-12-26 MED ORDER — TERIPARATIDE 620 MCG/2.48ML ~~LOC~~ SOPN: 20.0000 ug | PEN_INJECTOR | Freq: Every day | SUBCUTANEOUS | 11 refills | Status: DC

## 2020-12-27 ENCOUNTER — Other Ambulatory Visit: Payer: Self-pay

## 2020-12-27 ENCOUNTER — Ambulatory Visit (INDEPENDENT_AMBULATORY_CARE_PROVIDER_SITE_OTHER): Payer: Medicare Other | Admitting: Family Medicine

## 2020-12-27 ENCOUNTER — Encounter: Payer: Self-pay | Admitting: Family Medicine

## 2020-12-27 ENCOUNTER — Telehealth: Payer: Self-pay | Admitting: Internal Medicine

## 2020-12-27 VITALS — BP 136/76 | HR 91 | Temp 97.4°F | Ht 64.0 in

## 2020-12-27 DIAGNOSIS — F419 Anxiety disorder, unspecified: Secondary | ICD-10-CM | POA: Diagnosis not present

## 2020-12-27 DIAGNOSIS — R1310 Dysphagia, unspecified: Secondary | ICD-10-CM | POA: Diagnosis not present

## 2020-12-27 DIAGNOSIS — R682 Dry mouth, unspecified: Secondary | ICD-10-CM | POA: Diagnosis not present

## 2020-12-27 NOTE — Patient Instructions (Signed)
-  swallow study ordered  -can try special mouthwash for dry mouth, continue with lozenges, frequent water, chap stick.  If ANA  + referral to rheumatology   -hold buspar for a week and see if this helps dry mouth.    Safe travels!  Keep me posted!  Dr. Artis Flock
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
## 2020-12-27 NOTE — Telephone Encounter (Signed)
eliquis patient assistance application faxed to [employer] @ [phone]

## 2020-12-28 ENCOUNTER — Ambulatory Visit (HOSPITAL_COMMUNITY)
Admission: RE | Admit: 2020-12-28 | Discharge: 2020-12-28 | Disposition: A | Discharge status: Home / Self Care | Payer: Medicare Other | Source: Ambulatory Visit | Attending: Cardiology | Admitting: Cardiology

## 2020-12-28 ENCOUNTER — Telehealth: Payer: Self-pay

## 2020-12-28 DIAGNOSIS — R0989 Other specified symptoms and signs involving the circulatory and respiratory systems: Secondary | ICD-10-CM | POA: Diagnosis not present

## 2020-12-28 NOTE — Telephone Encounter (Signed)
Pt says that she has a prescription from her wound care Doc to use 50 percent Dakens solution, 25 percent from the Nurse, and 25 percent from Dr. Artis Flock. I explained that she needs to contact her Wound care specialist to see what it is that he would like her to use. Pt will follow up with wound care.

## 2020-12-28 NOTE — Telephone Encounter (Signed)
Tiani called in asking for advice, states the wound care nurse is there and noticed that the cream, wound care prescribed stated to take half but when looking at [facility] prescription it states to take a quarter. Asking on how much to use,

## 2020-12-28 NOTE — Telephone Encounter (Signed)
error 

## 2020-12-28 NOTE — Telephone Encounter (Signed)
LVM for pt to call the office back.  °

## 2020-12-28 NOTE — Telephone Encounter (Signed)
Let her know I just signed the paperwork that they sent me. I did not prescribe this or give the order. If any issues she is going to have to see her wound care doctor for this. Im so sorry,  Dr. Artis Flock

## 2020-12-29 ENCOUNTER — Telehealth: Payer: Self-pay | Admitting: Student

## 2020-12-29 ENCOUNTER — Other Ambulatory Visit (HOSPITAL_COMMUNITY): Payer: Self-pay

## 2020-12-29 ENCOUNTER — Other Ambulatory Visit: Payer: Self-pay

## 2020-12-29 DIAGNOSIS — R1310 Dysphagia, unspecified: Secondary | ICD-10-CM

## 2020-12-29 DIAGNOSIS — Z79899 Other long term (current) drug therapy: Secondary | ICD-10-CM

## 2020-12-29 LAB — ANA, IFA COMPREHENSIVE PANEL
Anti Nuclear Antibody (ANA): NEGATIVE
ENA SM Ab Ser-aCnc: <1 AI
SM/RNP: <1 AI
SSA (Ro) (ENA) Antibody, IgG: <1 AI
SSB (La) (ENA) Antibody, IgG: <1 AI
Scleroderma (Scl-70) (ENA) Antibody, IgG: <1 AI
ds DNA Ab: <1 IU/mL

## 2020-12-29 NOTE — Telephone Encounter (Signed)
Alison Monroe is returning Michelle's call. Please advise.

## 2021-01-03 ENCOUNTER — Encounter (HOSPITAL_BASED_OUTPATIENT_CLINIC_OR_DEPARTMENT_OTHER): Payer: Medicare Other | Admitting: Physician Assistant

## 2021-01-03 ENCOUNTER — Encounter: Payer: Self-pay | Admitting: Endocrinology

## 2021-01-04 ENCOUNTER — Ambulatory Visit (HOSPITAL_COMMUNITY): Admission: RE | Admit: 2021-01-04 | Payer: Medicare Other | Source: Ambulatory Visit

## 2021-01-04 ENCOUNTER — Telehealth: Payer: Self-pay | Admitting: Endocrinology

## 2021-01-04 ENCOUNTER — Telehealth (INDEPENDENT_AMBULATORY_CARE_PROVIDER_SITE_OTHER): Payer: Medicare Other | Admitting: Endocrinology

## 2021-01-04 ENCOUNTER — Other Ambulatory Visit: Payer: Self-pay

## 2021-01-04 ENCOUNTER — Ambulatory Visit (HOSPITAL_COMMUNITY)
Admission: RE | Admit: 2021-01-04 | Discharge: 2021-01-04 | Disposition: A | Discharge status: Home / Self Care | Payer: Medicare Other | Source: Ambulatory Visit | Attending: Family Medicine | Admitting: Family Medicine

## 2021-01-04 ENCOUNTER — Encounter (HOSPITAL_COMMUNITY): Payer: Self-pay

## 2021-01-04 DIAGNOSIS — M810 Age-related osteoporosis without current pathological fracture: Secondary | ICD-10-CM

## 2021-01-04 DIAGNOSIS — R1310 Dysphagia, unspecified: Secondary | ICD-10-CM
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
## 2021-01-04 NOTE — Patient Instructions (Addendum)
I have done a letter to [employer], to ask for approval of the bone density ASAP.   Please continue the same Forteo, Reclast, and Prolia. Please come back for a follow-up appointment in 6 months

## 2021-01-04 NOTE — Telephone Encounter (Signed)
please contact [employer]:  We need bone density covered ASAP, due to the need for surgery.  I did a letter, which is in epic.

## 2021-01-05 ENCOUNTER — Encounter: Payer: Self-pay | Admitting: Endocrinology

## 2021-01-08 ENCOUNTER — Telehealth: Payer: Self-pay | Admitting: Endocrinology

## 2021-01-08 NOTE — Telephone Encounter (Signed)
Please order/referral the bone density before pt can make an appt.

## 2021-01-08 NOTE — Telephone Encounter (Signed)
Either patient can call and schedule or you can for this it does not require a [phone]

## 2021-01-08 NOTE — Telephone Encounter (Signed)
Pt called returning Anna's call regarding her bone density scans and a surgery coming up. Please call back at [phone].

## 2021-01-08 NOTE — Telephone Encounter (Signed)
Notified pt to call for an appt.--[phone].

## 2021-01-08 NOTE — Addendum Note (Signed)
Addended by: Romero Belling on: 01/08/2021 12:12 PM   Modules accepted: Orders

## 2021-01-08 NOTE — Telephone Encounter (Signed)
Ok, I ordered 

## 2021-01-09 ENCOUNTER — Ambulatory Visit (HOSPITAL_COMMUNITY)
Admission: RE | Admit: 2021-01-09 | Discharge: 2021-01-09 | Disposition: A | Discharge status: Home / Self Care | Payer: Medicare Other | Source: Ambulatory Visit | Attending: Family Medicine | Admitting: Family Medicine

## 2021-01-09 ENCOUNTER — Other Ambulatory Visit: Payer: Self-pay

## 2021-01-09 DIAGNOSIS — R1310 Dysphagia, unspecified: Secondary | ICD-10-CM | POA: Insufficient documentation

## 2021-01-09 NOTE — Evaluation (Signed)
Objective Swallowing Evaluation: Type of Study: MBS-Modified Barium Swallow Study   Patient Details  Name: Alison Monroe MRN: [id number] Date of Birth: 12-Jan-1961  Today's Date: 01/09/2021 Time: SLP Start Time (ACUTE ONLY): 1158 -SLP Stop Time (ACUTE ONLY): 1232  SLP Time Calculation (min) (ACUTE ONLY): 34 min   Past Medical History:  Past Medical History:  Diagnosis Date  . Arthritis   . Asthma   . Atrial thrombus 05/26/2020   Resolved by echo 2021; lifelong anticoagulation  . Chronic kidney disease    Stage 3  . Diabetes (HCC)   . Heart murmur    Past Surgical History:  Past Surgical History:  Procedure Laterality Date  . ANKLE SURGERY Left   . BREAST BIOPSY    . CATARACT EXTRACTION  2015  . GALLBLADDER SURGERY  1981  . GASTRIC BYPASS    . LUMBAR SPINE SURGERY     Several. Rods, bolts and screws in place  . TOTAL KNEE ARTHROPLASTY Right 2015   HPI: Pt is a 60 y.o. female with PMH gastric bypass, CKD, asthma, atrial thrombus, DM, GERD, heart mumur. She presented for an outpatient modified barium swallow study due to c/o globus sensation and throat clearing with p.o. intake. Pt identified the valleculae and the the UES as the approximate site of the sensation. She stated that she has been most symptomatic with dry solids and meats. Pt stated that she has been symptomatic for ~4 weeks and that xerostomia started subsequent to her difficulty swallowing. Pt stated that her father has dysphagia and that her mother died from "choking" so she is especially concerned about her swallowing.   No data recorded   Assessment / Plan / Recommendation  CHL IP CLINICAL IMPRESSIONS 01/09/2021  Clinical Impression Pt presents with oropharyngeal dysphagia characterized by reduced posterior bolus propulsion with solids, prolonged bolus formation, reduced lingual retraction, and a minimal-mild pharyngeal delay. Pt demonstrated difficulty with A-P transport of less moist foods such as a  Nutrigrain bar/graham cracker, mild to moderate vallecular residue, and transient penetration (PAS 2). This depth of laryngeal invasion is considered to be [facility], but throat clearing/coughing were intermittently noted following these episodes while the fluoro was turn off, trace aspiration is therefore suspected. The swallow was often triggered with head of the liquid bolus at the level of the valleculae. Laryngeal invasion was improved with thin liquids via cup.  Vallecular residue was most significant with solids which required mastication and with large boluses. Residue was eliminated with secondary swallows and liquid washes. The impact of xerostomia on bolus formation and subsequent vallecular residue is considered. Esophageal screening revealed rentention of barium in the mid to lower esophagus. Please refer to the radiologist's report for further detail. A regular texture diet with thin liquids may be continued with observance of swallowing precautions listed below. Pt has expressed significant fear regarding her potentially "choking to death" due to her family history. It is recommended that she follow up with an outpatient SLP for dysphagia treatment. Esophageal assessment and/or GI follow up is also recommended for assessment of the esophagus.  SLP Visit Diagnosis Dysphagia, oropharyngeal phase (R13.12)  Attention and concentration deficit following --  Frontal lobe and executive function deficit following --  Impact on safety and function Mild aspiration risk      No flowsheet data found.   Prognosis 01/09/2021  Prognosis for Safe Diet Advancement Good  Barriers to Reach Goals --  Barriers/Prognosis Comment --    CHL IP DIET RECOMMENDATION 01/09/2021  SLP  Diet Recommendations Regular solids;Thin liquid  Liquid Administration via Cup;No straw  Medication Administration Whole meds with liquid  Compensations Slow rate;Small sips/bites;Follow solids with liquid  Postural Changes Remain  semi-upright after after feeds/meals (Comment);Seated upright at 90 degrees      CHL IP OTHER RECOMMENDATIONS 01/09/2021  Recommended Consults Consider GI evaluation;Consider esophageal assessment  Oral Care Recommendations Oral care BID  Other Recommendations --      CHL IP FOLLOW UP RECOMMENDATIONS 01/09/2021  Follow up Recommendations Outpatient SLP      No flowsheet data found.         CHL IP ORAL PHASE 01/09/2021  Oral Phase Impaired  Oral - Pudding Teaspoon --  Oral - Pudding Cup --  Oral - Honey Teaspoon --  Oral - Honey Cup --  Oral - Nectar Teaspoon --  Oral - Nectar Cup --  Oral - Nectar Straw --  Oral - Thin Teaspoon --  Oral - Thin Cup WFL  Oral - Thin Straw WFL  Oral - Puree WFL  Oral - Mech Soft Reduced posterior propulsion  Oral - Regular Reduced posterior propulsion  Oral - Multi-Consistency --  Oral - Pill WFL  Oral Phase - Comment --    CHL IP PHARYNGEAL PHASE 01/09/2021  Pharyngeal Phase Impaired  Pharyngeal- Pudding Teaspoon --  Pharyngeal --  Pharyngeal- Pudding Cup --  Pharyngeal --  Pharyngeal- Honey Teaspoon --  Pharyngeal --  Pharyngeal- Honey Cup --  Pharyngeal --  Pharyngeal- Nectar Teaspoon --  Pharyngeal --  Pharyngeal- Nectar Cup --  Pharyngeal --  Pharyngeal- Nectar Straw --  Pharyngeal --  Pharyngeal- Thin Teaspoon --  Pharyngeal --  Pharyngeal- Thin Cup Delayed swallow initiation-pyriform sinuses;Penetration/Aspiration before swallow;Penetration/Aspiration during swallow  Pharyngeal Material enters airway, remains ABOVE vocal cords then ejected out  Pharyngeal- Thin Straw Delayed swallow initiation-pyriform sinuses;Penetration/Aspiration before swallow;Penetration/Aspiration during swallow  Pharyngeal Material enters airway, remains ABOVE vocal cords then ejected out  Pharyngeal- Puree Pharyngeal residue - valleculae;Reduced tongue base retraction  Pharyngeal --  Pharyngeal- Mechanical Soft Reduced tongue base  retraction;Pharyngeal residue - valleculae  Pharyngeal --  Pharyngeal- Regular Reduced tongue base retraction;Pharyngeal residue - valleculae  Pharyngeal --  Pharyngeal- Multi-consistency --  Pharyngeal --  Pharyngeal- Pill Reduced tongue base retraction;Pharyngeal residue - valleculae  Pharyngeal --  Pharyngeal Comment --     CHL IP CERVICAL ESOPHAGEAL PHASE 01/09/2021  Cervical Esophageal Phase Impaired  Pudding Teaspoon --  Pudding Cup --  Honey Teaspoon --  Honey Cup --  Nectar Teaspoon --  Nectar Cup --  Nectar Straw --  Thin Teaspoon --  Thin Cup --  Thin Straw --  Puree --  Mechanical Soft --  Regular --  Multi-consistency --  Pill --  Cervical Esophageal Comment See impressions    Florian Chauca I. Vear Clock, MS, CCC-SLP Acute Rehabilitation Services Office number [phone] Pager [phone]  Scheryl Marten 01/09/2021, 2:14 PM

## 2021-01-10 ENCOUNTER — Inpatient Hospital Stay: Admission: RE | Admit: 2021-01-10 | Payer: Medicare Other | Source: Ambulatory Visit

## 2021-01-10 NOTE — Telephone Encounter (Signed)
Called pt --to call the office back

## 2021-01-12 ENCOUNTER — Encounter: Payer: Self-pay | Admitting: Family Medicine

## 2021-01-12 DIAGNOSIS — R1312 Dysphagia, oropharyngeal phase: Secondary | ICD-10-CM | POA: Insufficient documentation

## 2021-01-17 ENCOUNTER — Encounter: Payer: Self-pay | Admitting: Family Medicine

## 2021-01-17 DIAGNOSIS — R1310 Dysphagia, unspecified: Secondary | ICD-10-CM

## 2021-01-25 MED ORDER — TORSEMIDE 20 MG PO TABS: 10.0000 mg | ORAL_TABLET | Freq: Every day | ORAL | 0 refills | Status: DC

## 2021-01-25 MED ORDER — ESCITALOPRAM OXALATE 10 MG PO TABS: 10.0000 mg | ORAL_TABLET | Freq: Two times a day (BID) | ORAL | 0 refills | Status: DC

## 2021-01-25 MED ORDER — DILTIAZEM HCL ER COATED BEADS 120 MG PO CP24: 120.0000 mg | ORAL_CAPSULE | Freq: Every day | ORAL | 0 refills | Status: DC

## 2021-01-25 MED ORDER — "BD INSULIN SYRINGE 25G X 1"" 1 ML MISC": 1.0000 "application " | 0 refills | Status: AC

## 2021-01-25 MED ORDER — CYANOCOBALAMIN 1000 MCG/ML IJ SOLN: 1000.0000 ug | INTRAMUSCULAR | 0 refills | Status: AC

## 2021-01-25 MED ORDER — BUSPIRONE HCL 7.5 MG PO TABS: 7.5000 mg | ORAL_TABLET | Freq: Three times a day (TID) | ORAL | 0 refills | Status: AC

## 2021-01-31 ENCOUNTER — Telehealth: Payer: Self-pay

## 2021-01-31 NOTE — Telephone Encounter (Signed)
Will send note to Marjie Skiff, PA-C and Dr. Rennis Golden to see if they prefer pt be seen in the office before surgical procedure.  Right now, next OV is after the date of the proposed surgery.  Will also send to PharmD for rec's re: holding anticoagulation.  Tereso Newcomer, PA-C    01/31/2021 5:54 PM

## 2021-01-31 NOTE — Telephone Encounter (Signed)
   [facility] Pre-operative Risk Assessment    HEARTCARE STAFF: - Please ensure there is not already an duplicate clearance open for this procedure. - Under Visit Info/Reason for Call, type in Other and utilize the format Clearance MM/DD/YY or Clearance TBD. Do not use dashes or single digits. - If request is for dental extraction, please clarify the # of teeth to be extracted.  Request for surgical clearance:  1. What type of surgery is being performed? Pelvis extentison posterior spinal fusion  2. When is this surgery scheduled? 03/12/21   3. What type of clearance is required (medical clearance vs. Pharmacy clearance to hold med vs. Both)? Both  4. Are there any medications that need to be held prior to surgery and how long? Eliquis   5. Practice name and name of physician performing surgery? [facility], Dr. Fredrich Birks  6. What is the office phone number? [phone]   7.   What is the office fax number? [phone]  8.   Anesthesia type (None, local, MAC, general) ? Not listed   Alison Monroe 01/31/2021, 4:00 PM  _________________________________________________________________   (provider comments below)

## 2021-02-01 NOTE — Telephone Encounter (Signed)
Patient with diagnosis of A Fib on Eliquis for anticoagulation.    1. Procedure: Pelvis extentison posterior spinal fusion  Date of procedure: 03/12/21   CHA2DS2-VASc Score = 3  {This indicates a 3.2% annual risk of stroke. The patient's score is based upon: CHF History: No HTN History: Yes Diabetes History: Yes Stroke History: No Vascular Disease History: No Age Score: 0 Gender Score: 1   CrCl 61 mL/min Platelet count 316K   Per office protocol, patient can hold Eliquis for 3 days prior to procedure.   Patient will not need bridging with Lovenox (enoxaparin) around procedure.

## 2021-02-01 NOTE — Telephone Encounter (Signed)
Agree with recommendations to hold Eliquis 3 days prior to procedure as protocol - does not need to be seen prior to surgery for clearance.  Dr. Rennis Golden

## 2021-02-01 NOTE — Telephone Encounter (Signed)
   Primary Cardiologist: Chrystie Nose, MD  Chart reviewed as part of pre-operative protocol coverage. Given past medical history and time since last visit, based on ACC/AHA guidelines, Alison Monroe would be at acceptable risk for the planned procedure without further cardiovascular testing.   Per Dr. Rennis Golden, the patient does not require an office visit prior to surgery.     Date of procedure: 03/12/21   CHA2DS2-VASc Score = 3  {This indicates a 3.2% annual risk of stroke. The patient's score is based upon: CHF History: No HTN History: Yes Diabetes History: Yes Stroke History: No Vascular Disease History: No Age Score: 0 Gender Score: 1   CrCl 61 mL/min Platelet count 316K  Per office protocol, patient can hold Eliquis for 3 days prior to procedure.   Patient will not need bridging with Lovenox (enoxaparin) around procedure.  I will route this recommendation to the requesting party via Epic fax function and remove from pre-op pool.  Please call with questions.  Georgie Chard, NP 02/01/2021, 3:13 PM

## 2021-02-02 ENCOUNTER — Encounter: Payer: Self-pay | Admitting: Family Medicine

## 2021-02-12 NOTE — Telephone Encounter (Addendum)
Patient has not yet income criteria for eliquis assistance, per fax from [employer]. This si 3% of OOP Rx expenses for the calendar year, based on household adjusted gross income.   Application case # [id number]   Message sent to patient via MyChart

## 2021-02-14 ENCOUNTER — Telehealth: Payer: Self-pay

## 2021-02-14 ENCOUNTER — Ambulatory Visit: Payer: Medicare Other | Admitting: Family Medicine

## 2021-02-14 NOTE — Telephone Encounter (Signed)
Patient is taking Lyrica 100 mg twice daily-spoke to her today and she has not been seen for pain management-[facility] just needs new records to reflect the reason for her high dosage of meds she is on-I gave the patient my direct fax number so that she can have doc in [facility] and at [facility] to send records to me

## 2021-02-28 NOTE — Telephone Encounter (Signed)
Our office received another clearance request for the same procedure. Looks like clearance was previously faxed. I will resend clearance notes to requesting office fax # [phone].

## 2021-03-13 ENCOUNTER — Other Ambulatory Visit: Payer: Self-pay

## 2021-03-13 ENCOUNTER — Ambulatory Visit: Payer: Medicare Other | Admitting: Internal Medicine

## 2021-03-13 ENCOUNTER — Telehealth: Payer: Self-pay

## 2021-03-13 VITALS — BP 144/78 | HR 87 | Ht 64.0 in | Wt 228.6 lb

## 2021-03-13 DIAGNOSIS — I480 Paroxysmal atrial fibrillation: Secondary | ICD-10-CM | POA: Diagnosis not present

## 2021-03-13 DIAGNOSIS — I513 Intracardiac thrombosis, not elsewhere classified: Secondary | ICD-10-CM

## 2021-03-13 DIAGNOSIS — N183 Chronic kidney disease, stage 3 unspecified: Secondary | ICD-10-CM | POA: Diagnosis not present

## 2021-03-13 DIAGNOSIS — I10 Essential (primary) hypertension: Secondary | ICD-10-CM | POA: Diagnosis not present

## 2021-03-13 MED ORDER — DILTIAZEM HCL ER COATED BEADS 180 MG PO CP24: 180.0000 mg | ORAL_CAPSULE | Freq: Every day | ORAL | 3 refills | Status: DC

## 2021-03-13 NOTE — Telephone Encounter (Signed)
Okay to schedule sooner. 

## 2021-03-13 NOTE — Telephone Encounter (Signed)
Pt was unable to get surgery because she was told her Creatinine level was to high. There are also some other things she wants to discuss, according to pt. I offered her next available which is April 20. Pt would like to come sooner. Please advise

## 2021-03-13 NOTE — Patient Instructions (Signed)
Medication Instructions:  Your physician has recommended you make the following change in your medication:  -- INCREASE diltiazem 180mg  daily  *If you need a refill on your cardiac medications before your next appointment, please call your pharmacy*  Follow-Up: At [facility], you and your health needs are our priority.  As part of our continuing mission to provide you with exceptional heart care, we have created designated Provider Care Teams.  These Care Teams include your primary Cardiologist (physician) and Advanced Practice Providers (APPs -  Physician Assistants and Nurse Practitioners) who all work together to provide you with the care you need, when you need it.  We recommend signing up for the patient portal called "MyChart".  Sign up information is provided on this After Visit Summary.  MyChart is used to connect with patients for Virtual Visits (Telemedicine).  Patients are able to view lab/test results, encounter notes, upcoming appointments, etc.  Non-urgent messages can be sent to your provider as well.   To learn more about what you can do with MyChart, go to [facility].    Your next appointment:   6 month(s)  The format for your next appointment:   In Person  Provider:   You may see [email or web address], MD or one of the following Advanced Practice Providers on your designated Care Team:    Chrystie Nose, PA-C  Azalee Course, PA-C or   Micah Flesher, Judy Pimple    Other Instructions

## 2021-03-13 NOTE — Progress Notes (Signed)
OFFICE CONSULT NOTE  Chief Complaint:  Follow-up   Primary Care Physician: Orland Mustard, MD  HPI:  Alison Monroe is a 60 y.o. female who is being seen today for the evaluation of right atrial mass at the request of Orland Mustard, MD. This is a pleasant 59 year old female with unfortunate longstanding issues related to a spine injury secondary to fall number of years ago.  She is received care both in [place], [place] where she primarily resides, the [place] [facility] and also at [facility] most recently.  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  This required removal of the hardware and antibiotic therapy.  During that hospitalization she was found to have a 2 x 2 centimeter mass in the right atrium concerning for possible thrombus.  A cardiac MRI was performed which confirmed this and it was noted to be at the diaphragmatic portion of the right atrium adjacent to the IVC and in close proximity to the tricuspid valve.  It was not noted to extend in the SVC or IVC.  She had bilateral upper and lower extremity venous Dopplers in January at [facility] which were negative for thrombus and she has some paroxysmal atrial fibrillation noted during her hospitalization and she was started on Eliquis 5 mg twice daily.  The right atrial mass was noted in mid February.  Since then she is done fairly well except she does have a nonhealed wound in her back and will need a repeat surgery to restabilize the spine and close this wound.  Apparently this is depended on my recommendations for being able to come off anticoagulant therapy.  03/13/2021  Alison Monroe is seen today in follow-up.  She continues to undergo multiple surgeries for her spine.  She was supposed to have a surgery this past week but this was canceled due to renal insufficiency.  She thinks that perhaps a CT scan just prior to that with contrast  may have been responsible.  She has been requiring more Lasix which could be an issue because of lower extremity edema.  She continues on Eliquis for history of thrombosis.  Blood pressure was slightly elevated today.  We have her on diltiazem for control of A. fib as well and she reports relatively infrequent breakthroughs although does note at times her heart races.  PMHx:  Past Medical History:  Diagnosis Date  . Arthritis   . Asthma   . Atrial thrombus 05/26/2020   Resolved by echo 2021; lifelong anticoagulation  . Chronic kidney disease    Stage 3  . Diabetes (HCC)   . Heart murmur     Past Surgical History:  Procedure Laterality Date  . ANKLE SURGERY Left   . BREAST BIOPSY    . CATARACT EXTRACTION  2015  . GALLBLADDER SURGERY  1981  . GASTRIC BYPASS    . LUMBAR SPINE SURGERY     Several. Rods, bolts and screws in place  . TOTAL KNEE ARTHROPLASTY Right 2015    FAMHx:  Family History  Problem Relation Age of Onset  . Arthritis Mother   . Asthma Mother   . COPD Mother   . Depression Mother   . Diabetes Mother   . Early death Mother   . Heart disease Mother   . Hyperlipidemia Mother   . Hypertension Mother   . Heart attack Mother   . COPD Father   . Diabetes  Father   . Early death Father   . Heart attack Father   . Kidney disease Father   . Hypertension Father   . Stroke Father   . Diabetes Brother   . Heart disease Brother   . Hyperlipidemia Brother   . Hypertension Brother   . Arthritis Maternal Grandmother   . Asthma Maternal Grandmother   . Diabetes Maternal Grandmother   . Cancer Maternal Grandfather   . Osteoporosis Neg Hx     SOCHx:   reports that she has never smoked. She has never used smokeless tobacco. She reports that she does not drink alcohol and does not use drugs.  ALLERGIES:  Allergies  Allergen Reactions  . Erythromycin Other (See Comments)    GI upset  . Erythromycin Base     Unknown  . Other Swelling    dexon sutures   .  Penicillins Swelling  . Vancomycin Other (See Comments)    Kidney failure  . Adhesive  [Tape] Rash    Paper tape  . Ampicillin Swelling    ROS: Pertinent items noted in HPI and remainder of comprehensive ROS otherwise negative.  HOME MEDS: Current Outpatient Medications on File Prior to Visit  Medication Sig Dispense Refill  . acetaminophen (TYLENOL) 500 MG tablet Take 1,000 mg by mouth 2 (two) times daily as needed.    Marland Kitchen apixaban (ELIQUIS) 5 MG TABS tablet Take 1 tablet (5 mg total) by mouth 2 (two) times daily. 180 tablet 1  . busPIRone (BUSPAR) 7.5 MG tablet Take 1 tablet (7.5 mg total) by mouth 3 (three) times daily. 270 tablet 0  . cephALEXin (KEFLEX) 500 MG capsule Take by mouth.    . Cholecalciferol 25 MCG (1000 UT) capsule Take 6 capsules (6,000 Units total) by mouth daily. 180 capsule 11  . cyanocobalamin (,VITAMIN B-12,) 1000 MCG/ML injection Inject 1 mL (1,000 mcg total) into the skin every 30 (thirty) days. 3 mL 0  . cyclobenzaprine (FLEXERIL) 10 MG tablet Take 1 tablet (10 mg total) by mouth 3 (three) times daily as needed for muscle spasms. 90 tablet 1  . diclofenac Sodium (VOLTAREN) 1 % GEL Apply 2 g topically in the morning and at bedtime.     . diphenhydrAMINE (BENADRYL) 50 MG capsule Take 50 mg by mouth as needed for allergies or sleep.     Marland Kitchen doxycycline (VIBRAMYCIN) 100 MG capsule Take 100 mg by mouth 2 (two) times daily.    Marland Kitchen escitalopram (LEXAPRO) 10 MG tablet Take 1 tablet (10 mg total) by mouth in the morning and at bedtime. 180 tablet 0  . fentaNYL (DURAGESIC - DOSED MCG/HR) 100 MCG/HR Place 1 patch onto the skin every other day.    Marland Kitchen HYDROmorphone (DILAUDID) 8 MG tablet Take 1 tablet by mouth every 6 (six) hours.     . Insulin Pen Needle 32G X 4 MM MISC PRN 100 each 0  . Insulin Syringe-Needle U-100 (B-D INSULIN SYRINGE 1CC/25GX1") 25G X 1" 1 ML MISC 1 application by Does not apply route every 30 (thirty) days. 3 each 0  . Multiple Vitamin (MULTIVITAMIN) tablet  Take 1 tablet by mouth daily.    Marland Kitchen omeprazole (PRILOSEC) 20 MG capsule Take 1 capsule by mouth in the morning, at noon, and at bedtime.     . Teriparatide, Recombinant, (FORTEO) 620 MCG/2.48ML SOPN Inject 20 mcg into the skin daily. 2.48 mL 11  . torsemide (DEMADEX) 20 MG tablet Take 0.5 tablets (10 mg total) by mouth daily. 45 tablet  0   No current facility-administered medications on file prior to visit.    LABS/IMAGING: No results found for this or any previous visit (from the past 48 hour(s)). No results found.  LIPID PANEL:    Component Value Date/Time   CHOL 182 01/12/2019 1102   TRIG 64.0 01/12/2019 1102   HDL 56.10 01/12/2019 1102   CHOLHDL 3 01/12/2019 1102   VLDL 12.8 01/12/2019 1102   LDLCALC 113 (H) 01/12/2019 1102    WEIGHTS: Wt Readings from Last 3 Encounters:  03/13/21 228 lb 9.6 oz (103.7 kg)  11/16/20 216 lb 12.8 oz (98.3 kg)  10/30/20 219 lb (99.3 kg)    VITALS: BP (!) 144/78   Pulse 87   Ht 5\' 4"  (1.626 m)   Wt 228 lb 9.6 oz (103.7 kg)   SpO2 96%   BMI 39.24 kg/m   EXAM: General appearance: alert, no distress, moderately obese and in a torso/back brace Neck: no carotid bruit, no JVD and thyroid not enlarged, symmetric, no tenderness/mass/nodules Lungs: clear to auscultation bilaterally Heart: regular rate and rhythm, S1, S2 normal and systolic murmur: early systolic 2/6, blowing at apex Abdomen: soft, non-tender; bowel sounds normal; no masses,  no organomegaly and obese Extremities: extremities normal, atraumatic, no cyanosis or edema Pulses: 2+ and symmetric Skin: Pale, warm, dry Neurologic: Mental status: Alert, oriented, thought content appropriate Psych: Pleasant  EKG: Deferred  ASSESSMENT: 1. 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 Negative upper and LE dopplers in 12/2019 3. PAF - CHADSVASC score 2 4. HTN  PLAN: 1.   Ms. Knoebel seems to be doing well on Eliquis.  She should  continue on this indefinitely because a history of thrombosis as well as atrial fibrillation.  She continues to have episodes of A. fib which has been noted on her apple watch and she is symptomatic with it.  I would advise an increase in her diltiazem from 100 2180 mg daily.  This would also help her blood pressure.  Plan follow-up with me annually or sooner as necessary.  It would be okay as previously mentioned to hold her Eliquis 2 days prior to surgery if this is rescheduled. Restart after.  Stefanie Libel, MD, [facility], FACP  [place]  [facility]  Medical Director of the Advanced Lipid Disorders &  Cardiovascular Risk Reduction Clinic Diplomate of the American Board of Clinical Lipidology Attending Cardiologist  Direct Dial: [phone]  Fax: [phone]  Website:  [email or web address] Gidget Quizhpi 03/13/2021, 10:43 AM

## 2021-03-14 NOTE — Telephone Encounter (Signed)
Patient Is scheduled 03/15/21 at 1030

## 2021-03-15 ENCOUNTER — Encounter: Payer: Self-pay | Admitting: Family Medicine

## 2021-03-15 ENCOUNTER — Ambulatory Visit (INDEPENDENT_AMBULATORY_CARE_PROVIDER_SITE_OTHER): Payer: Medicare Other | Admitting: Family Medicine

## 2021-03-15 ENCOUNTER — Other Ambulatory Visit: Payer: Self-pay

## 2021-03-15 VITALS — BP 134/78 | HR 83 | Temp 97.6°F | Ht 64.0 in | Wt 229.6 lb

## 2021-03-15 DIAGNOSIS — E1122 Type 2 diabetes mellitus with diabetic chronic kidney disease: Secondary | ICD-10-CM

## 2021-03-15 DIAGNOSIS — N1831 Chronic kidney disease, stage 3a: Secondary | ICD-10-CM | POA: Diagnosis not present

## 2021-03-15 DIAGNOSIS — M4636 Infection of intervertebral disc (pyogenic), lumbar region: Secondary | ICD-10-CM

## 2021-03-15 DIAGNOSIS — N1832 Chronic kidney disease, stage 3b: Secondary | ICD-10-CM

## 2021-03-15 DIAGNOSIS — G894 Chronic pain syndrome: Secondary | ICD-10-CM | POA: Diagnosis not present

## 2021-03-15 LAB — COMPREHENSIVE METABOLIC PANEL
ALT: 13 U/L (ref 0–35)
AST: 20 U/L (ref 0–37)
Albumin: 3.6 g/dL (ref 3.5–5.2)
Alkaline Phosphatase: 80 U/L (ref 39–117)
BUN: 17 mg/dL (ref 6–23)
CO2: 31 mEq/L (ref 19–32)
Calcium: 9.1 mg/dL (ref 8.4–10.5)
Chloride: 104 mEq/L (ref 96–112)
Creatinine, Ser: 1.34 mg/dL — ABNORMAL HIGH (ref 0.40–1.20)
GFR: 43.35 mL/min — ABNORMAL LOW (ref >60.00)
Glucose, Bld: 135 mg/dL — ABNORMAL HIGH (ref 70–99)
Potassium: 4.4 mEq/L (ref 3.5–5.1)
Sodium: 142 mEq/L (ref 135–145)
Total Bilirubin: 0.4 mg/dL (ref 0.2–1.2)
Total Protein: 6.7 g/dL (ref 6.0–8.3)

## 2021-03-15 LAB — HEMOGLOBIN A1C: Hgb A1c MFr Bld: 7.4 % — ABNORMAL HIGH (ref 4.6–6.5)

## 2021-03-15 MED ORDER — TORSEMIDE 20 MG PO TABS: 20.0000 mg | ORAL_TABLET | Freq: Every day | ORAL | 1 refills | Status: AC

## 2021-03-15 MED ORDER — ESCITALOPRAM OXALATE 20 MG PO TABS: 20.0000 mg | ORAL_TABLET | Freq: Every day | ORAL | 1 refills | Status: AC

## 2021-03-15 NOTE — Patient Instructions (Signed)
-  repeat kidney function today, no overly worked up as only a tenth of a point over your baseline.   -start back your lasix.    -will take your pain records to referral lady for pain referral.

## 2021-03-15 NOTE — Progress Notes (Addendum)
Patient: Alison Monroe MRN: [id number] DOB: [birth year] PCP: Orland Mustard, MD     Subjective:  Chief Complaint  Patient presents with  . Discuss Labs    HPI: The patient is a 60 y.o. female who presents today to discuss creatine levels. She says that she needs to discuss personal situations with her brother who is also a pt. I made her aware that this is a HIPPA violation and can not be discussed.  Elevated creatinine Found elevated creatinine on 3/18. Creatinine was up to 1.7; however, her baseline creatinine is around 1.5-1.6. she thinks it was due to contrast CT. She has held her torsemide since that time. She has noticed that she has not urinated as much since she stopped her demadex. She has no CVA tenderness, fever/chills. No blood in her urine. No other nephrotoxic drugs. She is drinking water, but not as much as she should. Urine is normal color with no blood. She does have increased swelling in her lower legs increased from her baseline, but she is not taking her demadex.   Diabetes: Patient is here for follow up of type 2 diabetes. First diagnosed 2006 .  Currently on the following medications diet alone, on medication. Last A1C was 6.4. Currently not exercising and following diabetic diet.    Hypertension: Here for follow up of hypertension.  Currently on diltiazem 180mg , just recently bumped up to 180mg  by dr. . Takes medication as prescribed and denies any side effects. Exercise includes none. Weight has been stable. Denies any chest pain, headaches, shortness of breath, vision changes, swelling in lower extremities.   She also brought in her pain management records for [facility] to scan so she can get a pain referral here. (they wanted her records).   Also needs home health referral for her wound care for her back.    Review of Systems  Cardiovascular: Negative for chest pain.  Neurological: Negative for dizziness and headaches.    Allergies Patient is allergic to  erythromycin, erythromycin base, other, penicillins, vancomycin, adhesive  [tape], and ampicillin.  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  Surgical History Patient  has a past surgical history that includes Gallbladder surgery (1981); Breast biopsy; Lumbar spine surgery; Gastric bypass; Ankle surgery (Left); Cataract extraction (2015); and Total knee arthroplasty (Right, 2015).  Family History Pateint's family history includes Arthritis in her maternal grandmother and mother; Asthma in her maternal grandmother and mother; COPD in her father and mother; Cancer in her maternal grandfather; Depression in her mother; Diabetes in her brother, father, maternal grandmother, and mother; Early death in her father and mother; Heart attack in her father and mother; Heart disease in her brother and mother; Hyperlipidemia in her brother and mother; Hypertension in her brother, father, and mother; Kidney disease in her father; Stroke in her father.  Social History Patient  reports that she has never smoked. She has never used smokeless tobacco. She reports that she does not drink alcohol and does not use drugs.    Objective: Vitals:   03/15/21 1037  BP: 134/78  Pulse: 83  Temp: 97.6 F (36.4 C)  TempSrc: Temporal  SpO2: 97%  Weight: 229 lb 9.6 oz (104.1 kg)  Height: 5\' 4"  (1.626 m)    Body mass index is 39.41 kg/m.  Physical Exam Vitals reviewed.  Constitutional:      Appearance: Normal appearance. She is obese.  HENT:     Head: Normocephalic and atraumatic.  Cardiovascular:     Rate and Rhythm: Normal rate and regular rhythm.     Heart sounds: Normal heart sounds.  Pulmonary:     Effort: Pulmonary effort is normal.     Breath sounds: Normal breath sounds.  Abdominal:     General: Bowel sounds are normal.     Palpations: Abdomen is soft.     Tenderness: There  is no right CVA tenderness or left CVA tenderness.  Skin:    General: Skin is warm.     Capillary Refill: Capillary refill takes less than 2 seconds.  Neurological:     General: No focal deficit present.     Mental Status: She is alert and oriented to person, place, and time.  Psychiatric:        Mood and Affect: Mood normal.        Behavior: Behavior normal.        Assessment/plan: 1. Stage 3b chronic kidney disease (HCC) im not overly concerned with her creatinine. Not even an acute on chronic issue and only a tenth a point above baseline. Will recheck today. She says surgeon wants it 1.5 or less. Would start back her demadex. F/u in 3-6 months or as needed.  - Comprehensive metabolic panel  2. Chronic pain syndrome She has her pain management records with her. Will make sure I give to our referral coordinator so they can fax to [facility].   3. Type 2 diabetes mellitus with stage 3a chronic kidney disease, without long-term current use of insulin (HCC) Do not have current records. Was done in [place], will check today. Has done a good job a diet controlled diabetes. F/u in 6 months.  - Hemoglobin A1c  -refilled meds that needed it. (lexapro and torsemide)   3. Home health referral for wound care.   This visit occurred during the SARS-CoV-2 public health emergency.  Safety protocols were in place, including screening questions prior to the visit, additional usage of staff PPE, and extensive cleaning of exam room while observing appropriate contact time as indicated for disinfecting solutions.      Return in about 6 months (around 09/14/2021) for routine f/u wiht diabetes/htn/anxiety/ckd .   Orland Mustard, MD [facility]   03/15/2021

## 2021-03-17 ENCOUNTER — Encounter: Payer: Self-pay | Admitting: Family Medicine

## 2021-03-18 ENCOUNTER — Encounter: Payer: Self-pay | Admitting: Family Medicine

## 2021-03-20 ENCOUNTER — Telehealth: Payer: Self-pay

## 2021-03-20 DIAGNOSIS — N1831 Chronic kidney disease, stage 3a: Secondary | ICD-10-CM

## 2021-03-20 DIAGNOSIS — E1122 Type 2 diabetes mellitus with diabetic chronic kidney disease: Secondary | ICD-10-CM

## 2021-03-20 NOTE — Telephone Encounter (Signed)
Patient returned call for lab work and is still awaiting a referral for home heath and would like a call back

## 2021-03-20 NOTE — Telephone Encounter (Signed)
I spoke with the Alison Monroe to discuss lab concerns. She has already seen lab result note. Alison Monroe says that she does not want to go on medication. She says that her Dr. In [place] told her she can watch what she wants to eat. She says that because her brother is driving her crazy, she tends to overeat. She is requesting a Dietician, a Kidney Dr., and a referral to Home Health for wound care.   Please advise.

## 2021-03-23 ENCOUNTER — Telehealth: Payer: Self-pay

## 2021-03-23 NOTE — Addendum Note (Signed)
Addended by: Orland Mustard on: 03/23/2021 12:44 PM   Modules accepted: Orders

## 2021-03-23 NOTE — Addendum Note (Signed)
Addended by: Orland Mustard on: 03/23/2021 12:47 PM   Modules accepted: Orders

## 2021-03-23 NOTE — Telephone Encounter (Signed)
Referral to kidney doc and diabetic education. Will do home health as well and face to face from when I saw her on 03/15/21.   Her a1c is 7.4. I recommended medication but she does not want to do this and wants to really focus on diet and lifestyle changes. Will send to diabetic education and discussed if not better in 3 months will need medication.   Orland Mustard, MD [facility] [facility]

## 2021-03-23 NOTE — Telephone Encounter (Signed)
Pt states she was supposed to get a referral  For Home Health and that she hasn't heard anything. Says that she needs her back dressing everyday

## 2021-03-23 NOTE — Telephone Encounter (Signed)
Referral was sent to [facility] and they will reach out for scheduling

## 2021-03-23 NOTE — Telephone Encounter (Signed)
Called and talked to patient. Referral done.  Orland Mustard, MD [place] [facility]

## 2021-04-02 ENCOUNTER — Telehealth: Payer: Self-pay | Admitting: Internal Medicine

## 2021-04-02 NOTE — Telephone Encounter (Signed)
Patient has paperwork that she needs for the nurse/dr to fill out for her to get the Eliquis medication. Please advise

## 2021-04-02 NOTE — Telephone Encounter (Signed)
Left message for pt, informing her that she can drop paperwork off at office for Dr. Blanchie Dessert nurse to fill out forms and fax.

## 2021-04-06 ENCOUNTER — Other Ambulatory Visit: Payer: Self-pay | Admitting: Family Medicine

## 2021-04-06 DIAGNOSIS — S21209A Unspecified open wound of unspecified back wall of thorax without penetration into thoracic cavity, initial encounter: Secondary | ICD-10-CM

## 2021-04-06 DIAGNOSIS — Z22322 Carrier or suspected carrier of Methicillin resistant Staphylococcus aureus: Secondary | ICD-10-CM

## 2021-04-11 ENCOUNTER — Encounter (INDEPENDENT_AMBULATORY_CARE_PROVIDER_SITE_OTHER): Payer: Self-pay

## 2021-04-11 ENCOUNTER — Encounter: Payer: Self-pay | Admitting: Endocrinology

## 2021-04-12 NOTE — Telephone Encounter (Signed)
Referral has now been sent to [facility]

## 2021-04-13 ENCOUNTER — Encounter: Payer: Self-pay | Admitting: Endocrinology

## 2021-04-23 ENCOUNTER — Telehealth: Payer: Self-pay | Admitting: Internal Medicine

## 2021-04-23 ENCOUNTER — Telehealth: Payer: Self-pay | Admitting: Endocrinology

## 2021-04-23 NOTE — Telephone Encounter (Signed)
I spoke to Dr Senaida Ores.  I have also discussed this case with 2 other doctors here.  Neither recommends taking Evenity now--maybe in the future.  I offered to see if you wanted to just see the doctors at [facility] for this--if you want to, I will go along with it.

## 2021-04-23 NOTE — Telephone Encounter (Signed)
Sent patient a MyChart message regarding Eliquis assistance and AFib

## 2021-04-23 NOTE — Telephone Encounter (Signed)
Dr.Richardson at [facility] is asking for Dr.Ellison to please give him a call as soon as possible regarding this pt.   Callback # [phone]

## 2021-04-23 NOTE — Telephone Encounter (Signed)
Message sent thru MyChart 

## 2021-04-23 NOTE — Telephone Encounter (Signed)
Pt c/o medication issue:  1. Name of Medication: apixaban (ELIQUIS) 5 MG TABS tablet  2. How are you currently taking this medication (dosage and times per day)? 1 tablet twice a day  3. Are you having a reaction (difficulty breathing--STAT)? no  4. What is your medication issue? Patient states she brought copies of how much it costs for her medication to the office. She states it needs to be sent to [facility] to make sure it was $600 or more. She states she gave it to someone at the office and has not heard back. She states she also sent a mychart message about going into afib a month ago and has not heard back.

## 2021-04-24 NOTE — Telephone Encounter (Addendum)
Spoke with patient regarding AFib & Eliquis assistance  She reports her HR is very high when she is in AFib - per 4/19 MyChart message - she took PRN metoprolol succinate (not on med list), which alleviated her AFib episode. When I spoke with her now, she did not report chest pain, shortness of breath, lightheadedness, dizziness during episode(s)  She is concerned that MD did not get back to her on her AFib concerns - apologized and explained that this may have gotten lost in the midst of the back and forth about the Eliquis assistance. Also, per review of the 4/19 note, she noted her symptoms were alleviated.   Jan 2022Annamary Carolin PA discontinued metoprolol succinate and started patient on diltiazem 162m which was subsequently increased to 1869m She has had one other AFib episode that she can recall She reports that her heart is racing  She has an ApVisual merchandiser HR up to 119 per her recollection   [place] episodes can come and go. Advised she monitor frequency, duration, and if she is symptomatic.   In regards to Eliquis assistance, explained that she has not met out of pocket expenditures per the info received as attachments on the 4/19 MyChart message as it is either illegible or does not have any patient identifiers. Explained she will need to provide this info - provided fax & email. She needs to have spent about $600 on OOP rx expensed

## 2021-04-25 NOTE — Telephone Encounter (Signed)
Thanks for letting me know.  Dr. H 

## 2021-04-26 ENCOUNTER — Encounter: Payer: Medicare Other | Attending: Family Medicine | Admitting: Skilled Nursing Facility1

## 2021-05-01 ENCOUNTER — Telehealth: Payer: Self-pay

## 2021-05-01 NOTE — Telephone Encounter (Signed)
Spoke to the patient today and she is scheduled to see you on 05/11/21 to follow up on diabetes, htn, and anxiety recheck but she would like to know if she can change it to virtual instead of coming into the office-please advise

## 2021-05-02 NOTE — Telephone Encounter (Signed)
Not really if we are following up on diabetes... Orland Mustard, MD [place] [facility]

## 2021-05-02 NOTE — Telephone Encounter (Signed)
Lvm for the pt to give her the message below.

## 2021-05-03 ENCOUNTER — Telehealth: Payer: Self-pay

## 2021-05-03 NOTE — Telephone Encounter (Signed)
Pt returned Melitta's call. Please advise.  

## 2021-05-09 NOTE — Telephone Encounter (Signed)
Lvm to make pt aware that appointment for Friday, 5/27 is a Diabetes follow up and needs to be in the office.

## 2021-05-10 ENCOUNTER — Telehealth: Payer: Self-pay | Admitting: Internal Medicine

## 2021-05-10 ENCOUNTER — Ambulatory Visit: Payer: Medicare Other | Admitting: Family Medicine

## 2021-05-10 NOTE — Telephone Encounter (Signed)
Faxed eliquis assistance application to [facility]

## 2021-05-11 ENCOUNTER — Encounter: Payer: Self-pay | Admitting: Family Medicine

## 2021-05-11 ENCOUNTER — Telehealth (INDEPENDENT_AMBULATORY_CARE_PROVIDER_SITE_OTHER): Payer: Medicare Other | Admitting: Family Medicine

## 2021-05-11 ENCOUNTER — Other Ambulatory Visit: Payer: Self-pay

## 2021-05-11 VITALS — Ht 64.0 in | Wt 229.5 lb

## 2021-05-11 DIAGNOSIS — E1122 Type 2 diabetes mellitus with diabetic chronic kidney disease: Secondary | ICD-10-CM | POA: Diagnosis not present

## 2021-05-11 DIAGNOSIS — N1831 Chronic kidney disease, stage 3a: Secondary | ICD-10-CM

## 2021-05-11 DIAGNOSIS — F411 Generalized anxiety disorder: Secondary | ICD-10-CM | POA: Insufficient documentation

## 2021-05-11 DIAGNOSIS — F1120 Opioid dependence, uncomplicated: Secondary | ICD-10-CM

## 2021-05-11 MED ORDER — ELIQUIS 5 MG PO TABS: 5.0000 mg | ORAL_TABLET | Freq: Two times a day (BID) | ORAL | 1 refills | Status: DC

## 2021-05-11 NOTE — Progress Notes (Signed)
Patient: Alison Monroe MRN: [id number] DOB: 1961-01-15 PCP: Orland Mustard, MD     Subjective:  Chief Complaint  Patient presents with  . Diabetes  . Hypertension  . Anxiety    HPI: The patient is a 60 y.o. female who presents today for DM, Anxiety, and HTN. Pt says that she is feeling better on Buspar.   Diabetes Last a1c we checked was 7.4 and she wanted time to work on diet and exercise to get this better. Has been well controlled on diet alone in the past. Her doctor in [place] checked her a1c and it was 7.3 and he is giving her time to work on diet/exercise. I think this is reasonable, but would start medication if not much change after 3 months time, especially in light of her open wound in her spine. Needs tight glycemic control.   Anxiety Currently on lexapro 20mg  and buspar 7.5mg  Tid. Not sure if the buspar was contributing to her dry mouth, but she has remained on this. Her dry mouth comes and goes and she really likes her buspar and doesn't want to stop this.   Pain referral Our referral coordinator has reached out to everyone here and they will not take her due to high doses of her pain medication. She still has her pain doctor in [place] and is still getting medication and he will continue to see her.   Review of Systems  Constitutional: Negative for chills, diaphoresis and fever.  Respiratory: Negative for cough and shortness of breath.   Cardiovascular: Negative for chest pain and palpitations.  Gastrointestinal: Negative for abdominal pain.  Neurological: Negative for dizziness and headaches.    Allergies Patient is allergic to erythromycin, erythromycin base, other, penicillins, vancomycin, adhesive  [tape], and ampicillin.  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  Surgical History Patient  has a past surgical history that includes  Gallbladder surgery (1981); Breast biopsy; Lumbar spine surgery; Gastric bypass; Ankle surgery (Left); Cataract extraction (2015); and Total knee arthroplasty (Right, 2015).  Family History Pateint's family history includes Arthritis in her maternal grandmother and mother; Asthma in her maternal grandmother and mother; COPD in her father and mother; Cancer in her maternal grandfather; Depression in her mother; Diabetes in her brother, father, maternal grandmother, and mother; Early death in her father and mother; Heart attack in her father and mother; Heart disease in her brother and mother; Hyperlipidemia in her brother and mother; Hypertension in her brother, father, and mother; Kidney disease in her father; Stroke in her father.  Social History Patient  reports that she has never smoked. She has never used smokeless tobacco. She reports that she does not drink alcohol and does not use drugs.    Objective: Vitals:   05/11/21 0930  Weight: 229 lb 8 oz (104.1 kg)  Height: 5\' 4"  (1.626 m)    Body mass index is 39.39 kg/m.  Physical Exam Vitals reviewed.  Constitutional:      General: She is not in acute distress.    Appearance: Normal appearance. She is obese.  HENT:     Head: Normocephalic and atraumatic.  Pulmonary:     Effort: Pulmonary effort is normal.  Neurological:     General: No focal deficit present.     Mental Status: She is alert and oriented to person, place, and time.  Psychiatric:        Mood and Affect:  Mood normal.        Behavior: Behavior normal.        GAD 7 : Generalized Anxiety Score 05/11/2021 03/15/2021 12/27/2020  Nervous, Anxious, on Edge 1 1 1   Control/stop worrying 0 0 0  Worry too much - different things 1 0 1  Trouble relaxing 1 0 0  Restless 0 0 0  Easily annoyed or irritable 0 1 0  Afraid - awful might happen 0 0 0  Total GAD 7 Score 3 2 2   Anxiety Difficulty Not difficult at all - Very difficult     Assessment/plan: 1. Type 2 diabetes  mellitus with stage 3a chronic kidney disease, without long-term current use of insulin (HCC) -a1c at 7.3, now will be followed by her doctor in [place] -did discuss she needs tight glycemic control with her open wound so I would have a low threshold for medication, but she is now getting treated in [place].  -will f/u with her physician there.   2. GAD (generalized anxiety disorder) Well controlled on her lexapro and buspar. meds will be taken over by her physician in [place].   3. Narcotic dependence (HCC) Will continue to follow in [place]. She plans to not come back to [place] which honestly is a much better situation for her and healthy for her.   -all the best in [place], Im glad she is so happy and in a better place!    Return if symptoms worsen or fail to improve.   [place], MD [place] [facility]   05/11/2021

## 2021-05-18 ENCOUNTER — Telehealth: Payer: Self-pay | Admitting: Internal Medicine

## 2021-05-18 NOTE — Telephone Encounter (Signed)
Follow Up:     Pt wants to know if Eileen Stanford have heard anything from [facility]. She wants to know if they are going to assist her with her Eliquis? She says she have not had any for 6 days.

## 2021-05-18 NOTE — Telephone Encounter (Signed)
Called patient, advised that Alison Monroe was out of the office this afternoon, I would send her a message to make her aware and to check for any updates.  But I would advise her to call and check in on the status for a quicker response. Patient given number, and will call.  Patient was appreciative of call back and information.

## 2021-05-19 ENCOUNTER — Other Ambulatory Visit: Payer: Self-pay | Admitting: Internal Medicine

## 2021-05-21 NOTE — Telephone Encounter (Signed)
Spoke with [employer]. Was informed that OOP expense report was not received, despite this being faxed on 5/26. Asked rep to expedite application and I will re-fax this morning.   Notified patient via MyChart

## 2021-05-22 ENCOUNTER — Encounter: Payer: Self-pay | Admitting: Endocrinology

## 2021-05-23 NOTE — Telephone Encounter (Signed)
Called [employer] to follow up on status of application. They said patient has 2 accounts created and they need to merge the info. They asked for me to call back tomorrow for an update

## 2021-05-28 NOTE — Telephone Encounter (Signed)
Called [employer] to follow up on application - was informed that what income amount was provided on application differed from [facility] statement provided, so a soft credit check was ran, which put her income around $21,000 and if this is correct she has not met the income criteria. Patient will need to call [employer], provide income, and advise them if she filed her taxes for 2021

## 2021-06-22 ENCOUNTER — Encounter: Payer: Self-pay | Admitting: Endocrinology

## 2021-06-27 ENCOUNTER — Other Ambulatory Visit: Payer: Self-pay | Admitting: Endocrinology

## 2021-06-27 ENCOUNTER — Telehealth: Payer: Self-pay | Admitting: Nutrition

## 2021-06-27 DIAGNOSIS — M810 Age-related osteoporosis without current pathological fracture: Secondary | ICD-10-CM

## 2021-06-27 NOTE — Telephone Encounter (Signed)
Do you want Labs before reclast?

## 2021-07-13 ENCOUNTER — Other Ambulatory Visit: Payer: Self-pay

## 2021-07-13 ENCOUNTER — Other Ambulatory Visit (INDEPENDENT_AMBULATORY_CARE_PROVIDER_SITE_OTHER): Payer: Medicare Other

## 2021-07-13 DIAGNOSIS — M810 Age-related osteoporosis without current pathological fracture: Secondary | ICD-10-CM | POA: Diagnosis not present

## 2021-07-13 LAB — BASIC METABOLIC PANEL
BUN: 20 mg/dL (ref 6–23)
CO2: 29 mEq/L (ref 19–32)
Calcium: 8.8 mg/dL (ref 8.4–10.5)
Chloride: 105 mEq/L (ref 96–112)
Creatinine, Ser: 1.17 mg/dL (ref 0.40–1.20)
GFR: 50.9 mL/min — ABNORMAL LOW (ref >60.00)
Glucose, Bld: 116 mg/dL — ABNORMAL HIGH (ref 70–99)
Potassium: 4.7 mEq/L (ref 3.5–5.1)
Sodium: 141 mEq/L (ref 135–145)

## 2021-07-13 LAB — VITAMIN D 25 HYDROXY (VIT D DEFICIENCY, FRACTURES): VITD: 57.61 ng/mL (ref 30.00–100.00)

## 2021-07-13 NOTE — Telephone Encounter (Signed)
Pt is wondering if she can have her prolia the same day as the reclast? As well as if there is a copay for the recalst?

## 2021-07-16 ENCOUNTER — Other Ambulatory Visit: Payer: Medicare Other

## 2021-07-16 LAB — PTH, INTACT AND CALCIUM
Calcium: 8.7 mg/dL (ref 8.6–10.4)
PTH: 31 pg/mL (ref 16–77)

## 2021-07-17 ENCOUNTER — Ambulatory Visit: Payer: Medicare Other

## 2021-07-18 NOTE — Telephone Encounter (Signed)
LVM for pt to let her know that she may need to contact the office to get rescheduled for Reclast since she did not show up or cancel her last appt. And also, she will need to wait til Everardo All returns to schedule her for her prolia injection

## 2021-07-18 NOTE — Telephone Encounter (Signed)
Patient was informed on 07/16/21 that she will need to call to her insurance to get copay information.  She was told that she will need to come back for Prolia injection, and was wondering if she can come on Friday for this.  Per Dr. Remus Blake verbal message, she must wait until Dr. Everardo All returns to schedule Prolia.  She did not show for her reclast infusion, did not cancel appointment.

## 2021-07-19 NOTE — Telephone Encounter (Signed)
Patient returned Patricia's call and requests to be called at ph# [phone]

## 2021-07-23 NOTE — Telephone Encounter (Signed)
Appointment scheduled for reclast

## 2021-07-23 NOTE — Telephone Encounter (Signed)
Appointment for reclast was rescheduled due to no MD in office this AM

## 2021-07-31 NOTE — Telephone Encounter (Signed)
Reclast scheduled and given today

## 2021-07-31 NOTE — Telephone Encounter (Signed)
Reclast scheduled and given today

## 2021-08-06 ENCOUNTER — Telehealth: Payer: Self-pay | Admitting: Internal Medicine

## 2021-08-06 NOTE — Telephone Encounter (Signed)
Advise patient seek care with PCP in [place] or urgent care if not available. Not much I can do from here.  Dr Rennis Golden

## 2021-08-06 NOTE — Telephone Encounter (Signed)
Patient c/o Palpitations:  High priority if patient c/o lightheadedness, shortness of breath, or chest pain  How long have you had palpitations/irregular HR/ Afib? Are you having the symptoms now? Started Saturday night  Are you currently experiencing lightheadedness, SOB or CP? No   Do you have a history of afib (atrial fibrillation) or irregular heart rhythm? Yes   Have you checked your BP or HR? (document readings if available): HR - 105  Are you experiencing any other symptoms? fatigued

## 2021-08-06 NOTE — Telephone Encounter (Signed)
Returned call to Pt.  Per Pt she went into afib on Saturday.  Heart was "fast and skipping a beat".  She felt tired, and like she couldn't move.  States she only got up to go to the bathroom.  States her heart rate was 108-118.  Pt states she took 2 doses of metoprolol Saturday and Sunday.  (Metoprolol is not currently on med list)  She states the afib stopped yesterday (Sunday) at 5:00 pm.  Pt states she does not want that to happen again.   Pt resides in [place].  Advised Pt to call her PCP in [place] to be referred locally for afib treatment.  Pt also requests a message be sent to Dr. Rennis Golden to see what he advises.

## 2021-08-08 NOTE — Telephone Encounter (Signed)
Pt states she does not understand why Dr. Rennis Golden will not give her recommendation while she is in [facility]. She states she cannot get in with a cardiologist in [facility] until October. I attempted to explain to her that it would be beneficial for her to be seen, at least by PCP or urgent care, if she goes into AF and unable to convert with her usual medication treatment. She requested another message be sent to provider to seek additional recommendation.

## 2021-08-08 NOTE — Telephone Encounter (Signed)
We could offer a virtual visit if she has moved to [place] until she establishes with her new cardiologist.   Dr. Rennis Golden

## 2021-08-09 NOTE — Telephone Encounter (Signed)
Attempted to call patient - left message to call back.   Sent MyChart message

## 2021-08-14 NOTE — Telephone Encounter (Signed)
Patient has MyChart appt on 08/28/21

## 2021-08-28 ENCOUNTER — Telehealth (INDEPENDENT_AMBULATORY_CARE_PROVIDER_SITE_OTHER): Payer: Medicare Other | Admitting: Internal Medicine

## 2021-08-28 ENCOUNTER — Encounter: Payer: Self-pay | Admitting: Internal Medicine

## 2021-08-28 VITALS — BP 147/76 | HR 67 | Ht 64.0 in | Wt 200.0 lb

## 2021-08-28 DIAGNOSIS — Z7901 Long term (current) use of anticoagulants: Secondary | ICD-10-CM | POA: Diagnosis not present

## 2021-08-28 DIAGNOSIS — I480 Paroxysmal atrial fibrillation: Secondary | ICD-10-CM

## 2021-08-28 DIAGNOSIS — I513 Intracardiac thrombosis, not elsewhere classified: Secondary | ICD-10-CM | POA: Diagnosis not present

## 2021-08-28 MED ORDER — DILTIAZEM HCL ER COATED BEADS 240 MG PO CP24: 240.0000 mg | ORAL_CAPSULE | Freq: Every day | ORAL | 3 refills | Status: DC

## 2021-08-28 NOTE — Progress Notes (Signed)
Virtual Visit via Video Note   This visit type was conducted due to national recommendations for restrictions regarding the COVID-19 Pandemic (e.g. social distancing) in an effort to limit this patient's exposure and mitigate transmission in our community.  Due to her co-morbid illnesses, this patient is at least at moderate risk for complications without adequate follow up.  This format is felt to be most appropriate for this patient at this time.  All issues noted in this document were discussed and addressed.  A limited physical exam was performed with this format.  Please refer to the patient's chart for her consent to telehealth for [facility].      Date:  08/29/2021   ID:  Alison Monroe, DOB May 10, 1961, MRN [id number] The patient was identified using 2 identifiers.  Evaluation Performed:  Follow-Up Visit  Patient Location:  [address] [facility] [address]  Provider location:   [address] [place], [place] [address]  PCP:  Orland Mustard, MD  Cardiologist:  Chrystie Nose, MD Electrophysiologist:  None   Chief Complaint: Follow-up A. fib  History of Present Illness:    Alison Monroe is a 60 y.o. female who presents via audio/video conferencing for a telehealth visit today.  Alison Monroe is a 61 y.o. female who is being seen today for the evaluation of right atrial mass at the request of Orland Mustard, MD. This is a pleasant 60 year old female with unfortunate longstanding issues related to a spine injury secondary to fall number of years ago.  She is received care both in [place], [place] where she primarily resides, the [facility] and also at [facility] most recently.  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  This required removal of the hardware and antibiotic therapy.  During that hospitalization she was found to have a  2 x 2 centimeter mass in the right atrium concerning for possible thrombus.  A cardiac MRI was performed which confirmed this and it was noted to be at the diaphragmatic portion of the right atrium adjacent to the IVC and in close proximity to the tricuspid valve.  It was not noted to extend in the SVC or IVC.  She had bilateral upper and lower extremity venous Dopplers in January at [facility] which were negative for thrombus and she has some paroxysmal atrial fibrillation noted during her hospitalization and she was started on Eliquis 5 mg twice daily.  The right atrial mass was noted in mid February.  Since then she is done fairly well except she does have a nonhealed wound in her back and will need a repeat surgery to restabilize the spine and close this wound.  Apparently this is depended on my recommendations for being able to come off anticoagulant therapy.  03/13/2021  Alison Monroe is seen today in follow-up.  She continues to undergo multiple surgeries for her spine.  She was supposed to have a surgery this past week but this was canceled due to renal insufficiency.  She thinks that perhaps a CT scan just prior to that with contrast may have been responsible.  She has been requiring more Lasix which could be an issue because of lower extremity edema.  She continues on Eliquis for history of thrombosis.  Blood pressure was slightly elevated today.  We have her on diltiazem for control of A. fib as well and she reports  relatively infrequent breakthroughs although does note at times her heart races.  08/28/2021  Alison Monroe is seen today for video follow-up.  She is actually moved to [place] full-time in the [place] area but maintains connection to [facility] and says she will be coming back periodically.  She is in the process of establishing with a cardiologist there but recently has been having issues with palpitations and recurrent atrial fibrillation.  She monitors her heart rate with an apple  watch and has noted some recurrent A. fib which is paroxysmal with rates up to the 140s.  She has previously been on diltiazem 120 mg daily.  I had advised increasing her further in that dose but she was never able to get the medicine prescribed.  She did have an old prescription for some metoprolol and was taking that with some benefit.  She denies any chest pain.  She has been compliant with her Eliquis.  The patient does not have symptoms concerning for COVID-19 infection (fever, chills, cough, or new SHORTNESS OF BREATH).    Prior CV studies:   The following studies were reviewed today:  Chart reviewed, lab work  PMHx:  Past Medical History:  Diagnosis Date   Arthritis    Asthma    Atrial thrombus 05/26/2020   Resolved by echo 2021; lifelong anticoagulation   Chronic kidney disease    Stage 3   Diabetes (HCC)    Heart murmur     Past Surgical History:  Procedure Laterality Date   ANKLE SURGERY Left    BREAST BIOPSY     CATARACT EXTRACTION  2015   GALLBLADDER SURGERY  1981   GASTRIC BYPASS     LUMBAR SPINE SURGERY     Several. Rods, bolts and screws in place   TOTAL KNEE ARTHROPLASTY Right 2015    FAMHx:  Family History  Problem Relation Age of Onset   Arthritis Mother    Asthma Mother    COPD Mother    Depression Mother    Diabetes Mother    Early death Mother    Heart disease Mother    Hyperlipidemia Mother    Hypertension Mother    Heart attack Mother    COPD Father    Diabetes Father    Early death Father    Heart attack Father    Kidney disease Father    Hypertension Father    Stroke Father    Diabetes Brother    Heart disease Brother    Hyperlipidemia Brother    Hypertension Brother    Arthritis Maternal Grandmother    Asthma Maternal Grandmother    Diabetes Maternal Grandmother    Cancer Maternal Grandfather    Osteoporosis Neg Hx     SOCHx:   reports that she has never smoked. She has never used smokeless tobacco. She reports that she does  not drink alcohol and does not use drugs.  ALLERGIES:  Allergies  Allergen Reactions   Erythromycin Other (See Comments)    GI upset   Erythromycin Base     Unknown   Other Swelling    dexon sutures    Penicillins Swelling   Vancomycin Other (See Comments)    Kidney failure   Adhesive  [Tape] Rash    Paper tape   Ampicillin Swelling    MEDS:  Current Meds  Medication Sig   acetaminophen (TYLENOL) 500 MG tablet Take 1,000 mg by mouth 2 (two) times daily as needed.   busPIRone (BUSPAR) 7.5 MG  tablet Take 1 tablet (7.5 mg total) by mouth 3 (three) times daily.   Cholecalciferol 25 MCG (1000 UT) capsule Take 6 capsules (6,000 Units total) by mouth daily.   cyanocobalamin (,VITAMIN B-12,) 1000 MCG/ML injection Inject 1 mL (1,000 mcg total) into the skin every 30 (thirty) days.   diclofenac Sodium (VOLTAREN) 1 % GEL Apply 2 g topically in the morning and at bedtime.    diltiazem (CARDIZEM CD) 240 MG 24 hr capsule Take 1 capsule (240 mg total) by mouth daily.   diphenhydrAMINE (BENADRYL) 50 MG capsule Take 50 mg by mouth as needed for allergies or sleep.    ELIQUIS 5 MG TABS tablet TAKE 1 TABLET BY MOUTH  TWICE DAILY   escitalopram (LEXAPRO) 20 MG tablet Take 1 tablet (20 mg total) by mouth daily.   fentaNYL (DURAGESIC - DOSED MCG/HR) 100 MCG/HR Place 1 patch onto the skin every other day.   HYDROmorphone (DILAUDID) 8 MG tablet Take 1 tablet by mouth every 6 (six) hours.    Insulin Pen Needle 32G X 4 MM MISC PRN   Insulin Syringe-Needle U-100 (B-D INSULIN SYRINGE 1CC/25GX1") 25G X 1" 1 ML MISC 1 application by Does not apply route every 30 (thirty) days.   Multiple Vitamin (MULTIVITAMIN) tablet Take 1 tablet by mouth daily.   omeprazole (PRILOSEC) 20 MG capsule Take 1 capsule by mouth in the morning, at noon, and at bedtime.    potassium chloride SA (KLOR-CON) 20 MEQ tablet 20 mEq as needed   pregabalin (LYRICA) 100 MG capsule Take by mouth.   Teriparatide, Recombinant, (FORTEO) 620  MCG/2.48ML SOPN Inject 20 mcg into the skin daily.   torsemide (DEMADEX) 20 MG tablet Take 1 tablet (20 mg total) by mouth daily.   [DISCONTINUED] diltiazem (CARDIZEM CD) 180 MG 24 hr capsule Take 1 capsule (180 mg total) by mouth daily.     ROS: Pertinent items noted in HPI and remainder of comprehensive ROS otherwise negative.  Labs/Other Tests and Data Reviewed:    Recent Labs: 11/16/2020: Hemoglobin 12.2; Platelets 316; TSH 1.69 03/15/2021: ALT 13 07/13/2021: BUN 20; Creatinine, Ser 1.17; Potassium 4.7; Sodium 141   Recent Lipid Panel Lab Results  Component Value Date/Time   CHOL 182 01/12/2019 11:02 AM   TRIG 64.0 01/12/2019 11:02 AM   HDL 56.10 01/12/2019 11:02 AM   CHOLHDL 3 01/12/2019 11:02 AM   LDLCALC 113 (H) 01/12/2019 11:02 AM    Wt Readings from Last 3 Encounters:  08/28/21 200 lb (90.7 kg)  05/11/21 229 lb 8 oz (104.1 kg)  03/15/21 229 lb 9.6 oz (104.1 kg)     Exam:    Vital Signs:  BP (!) 147/76   Pulse 67   Ht 5\' 4"  (1.626 m)   Wt 200 lb (90.7 kg)   BMI 34.33 kg/m    General appearance: alert and no distress Lungs: No audible wheezing Abdomen: Obese Extremities: extremities normal, atraumatic, no cyanosis or edema Skin: Skin color, texture, turgor normal. 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 Monroe is having recurrent atrial fibrillation with rapid ventricular response at times.  Although she has had some improvement with adding metoprolol to lower her diltiazem, she has had some fatigue and other side effects related to beta-blockers in the past.  I would advise  increasing her diltiazem further up to 240 mg daily.  I think she has blood pressure and heart rate room to tolerate that.  Hopefully this will give her some improvement  however ultimately she may need an antiarrhythmic therapy.  She is planning on establishing with a cardiologist in October and I think this could be further discussed at that time.  As long as there is active communication between that cardiologist and myself, I am happy to see her in follow-up when she is in [place] or if she has issues.  COVID-19 Education: The signs and symptoms of COVID-19 were discussed with the patient and how to seek care for testing (follow up with PCP or arrange E-visit).  The importance of social distancing was discussed today.  Patient Risk:   After full review of this patients clinical status, I feel that they are at least moderate risk at this time.  Time:   Today, I have spent 25 minutes with the patient with telehealth technology discussing atrial fibrillation, hypertension, anticoagulation.     Medication Adjustments/Labs and Tests Ordered: Current medicines are reviewed at length with the patient today.  Concerns regarding medicines are outlined above.   Tests Ordered: No orders of the defined types were placed in this encounter.   Medication Changes: Meds ordered this encounter  Medications   diltiazem (CARDIZEM CD) 240 MG 24 hr capsule    Sig: Take 1 capsule (240 mg total) by mouth daily.    Dispense:  90 capsule    Refill:  3    Disposition:  in 6 month(s)  Chrystie Nose, MD, [facility], FACP  [place]  [facility]  Medical Director of the Advanced Lipid Disorders &  Cardiovascular Risk Reduction Clinic Diplomate of the American Board of Clinical Lipidology Attending Cardiologist  Direct Dial: [phone]  Fax: [phone]  Website:  [email or web address]  Chrystie Nose, MD  08/29/2021 3:13 PM

## 2021-08-28 NOTE — Patient Instructions (Signed)
Medication Instructions:  INCREASE diltiazem to 240mg  daily  *If you need a refill on your cardiac medications before your next appointment, please call your pharmacy*    Follow-Up: At [facility], you and your health needs are our priority.  As part of our continuing mission to provide you with exceptional heart care, we have created designated Provider Care Teams.  These Care Teams include your primary Cardiologist (physician) and Advanced Practice Providers (APPs -  Physician Assistants and Nurse Practitioners) who all work together to provide you with the care you need, when you need it.  We recommend signing up for the patient portal called "MyChart".  Sign up information is provided on this After Visit Summary.  MyChart is used to connect with patients for Virtual Visits (Telemedicine).  Patients are able to view lab/test results, encounter notes, upcoming appointments, etc.  Non-urgent messages can be sent to your provider as well.   To learn more about what you can do with MyChart, go to [facility].    Your next appointment:   6 month(s) - virtual visit or in-person if in town  The format for your next appointment:   In Person  Provider:   [email or web address] Kirtland Bouchard Hilty, MD   Other Instructions

## 2021-09-03 ENCOUNTER — Telehealth: Payer: Self-pay

## 2021-09-03 NOTE — Telephone Encounter (Signed)
Incoming call came in from Dr. Patria Mane for the pt to clarify the medications that pt was on. I told Dr. Patria Mane that according to the notes from the last visit 1/22 Alison Monroe stated for her to continue the same 3 medication Forteo, Reclast and Prolia.

## 2021-09-10 ENCOUNTER — Telehealth (INDEPENDENT_AMBULATORY_CARE_PROVIDER_SITE_OTHER): Payer: Medicare Other | Admitting: Endocrinology

## 2021-09-10 ENCOUNTER — Other Ambulatory Visit: Payer: Self-pay

## 2021-09-10 VITALS — Ht 64.0 in | Wt 200.0 lb

## 2021-09-10 DIAGNOSIS — M810 Age-related osteoporosis without current pathological fracture: Secondary | ICD-10-CM | POA: Diagnosis not present
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
## 2021-09-10 NOTE — Patient Instructions (Signed)
Please send [place] BMD result ASAP. Please tell us who PCP is, so I can send CC of my note there

## 2021-09-25 ENCOUNTER — Other Ambulatory Visit: Payer: Self-pay | Admitting: Internal Medicine

## 2021-09-26 NOTE — Telephone Encounter (Signed)
Prescription refill request for Eliquis received. Indication:atrial fib Last office visit:9/22 Scr:1.1 Age: 60 Weight:90.7 kg  Prescription refilled

## 2021-10-14 ENCOUNTER — Encounter: Payer: Self-pay | Admitting: Endocrinology

## 2021-10-29 ENCOUNTER — Other Ambulatory Visit: Payer: Self-pay | Admitting: Physician Assistant

## 2022-01-19 ENCOUNTER — Other Ambulatory Visit: Payer: Self-pay | Admitting: Endocrinology

## 2022-02-27 ENCOUNTER — Encounter: Payer: Self-pay | Admitting: Internal Medicine

## 2022-02-27 ENCOUNTER — Encounter: Payer: Medicare Other | Admitting: Internal Medicine

## 2022-02-27 ENCOUNTER — Other Ambulatory Visit: Payer: Self-pay

## 2022-02-27 NOTE — Progress Notes (Signed)
Erroneous encounter

## 2022-06-02 ENCOUNTER — Other Ambulatory Visit: Payer: Self-pay | Admitting: Internal Medicine

## 2022-08-16 ENCOUNTER — Other Ambulatory Visit: Payer: Self-pay | Admitting: Internal Medicine

## 2022-08-20 NOTE — Telephone Encounter (Signed)
Prescription refill request for Eliquis received. Indication: PAF Last office visit: 08/28/21  Alison Route MD Scr: 1.17 on 07/13/21 Age: 61 Weight: 90.7  Based on above findings Eliquis 5mg  twice daily is the appropriate dose.  Refill approved x 1 only.  She is past due for MD appt and labs.  Message sent to schedulers.

## 2022-09-28 ENCOUNTER — Other Ambulatory Visit: Payer: Self-pay | Admitting: Internal Medicine

## 2022-09-30 NOTE — Telephone Encounter (Signed)
Prescription refill request for Eliquis received. Indication: PAF Last office visit: 08/28/21  Alma Friendly MD Scr: 1.17 on 07/13/22 Age: 61 Weight: 90.7kg  Based on above findings Eliquis 5mg  twice daily is the appropriate dose.  Pt is past due for appt with Dr Debara Pickett and lab work.  Message sent to schedulers to schedule.  Refill approved x 1 only.

## 2022-10-19 ENCOUNTER — Other Ambulatory Visit: Payer: Self-pay | Admitting: Internal Medicine

## 2022-10-21 NOTE — Telephone Encounter (Signed)
Prescription refill request for Eliquis received. Indication:afib Last office visit:needs appt IWL:NLGXQ labs Age: 62 Weight:90.7 kg  Prescription refilled

## 2022-11-19 ENCOUNTER — Other Ambulatory Visit: Payer: Self-pay | Admitting: Internal Medicine

## 2022-11-19 NOTE — Telephone Encounter (Signed)
Prescription refill request for Eliquis received. Indication:afib Last office visit:needs appt Scr:1.1 Age: 61 Weight:90.7  kg  Prescription refilled

## 2022-12-17 ENCOUNTER — Telehealth: Payer: Self-pay | Admitting: *Deleted

## 2022-12-17 NOTE — Telephone Encounter (Signed)
Prescription refill request for Eliquis received. Indication: atrial thrombus, afib  Last office visit: [facility], 08/28/2021  Scr: 1.68, 03/27/2022 Age: 62 yo  Weight: 90.7 kg  Pt would like a refill sent to [facility].  Pt is overdue to see cardiologist. Msg sent to schedulers.

## 2022-12-19 NOTE — Telephone Encounter (Signed)
Attempted to call pt multiple times to schedule overdue appt. No answer, unable to leave voicemail. Unable to refill medication at this time.

## 2022-12-30 ENCOUNTER — Encounter: Payer: Self-pay | Admitting: Internal Medicine

## 2023-01-27 ENCOUNTER — Other Ambulatory Visit: Payer: Self-pay | Admitting: Internal Medicine

## 2023-01-28 NOTE — Telephone Encounter (Signed)
Attempted to call pt with phone number on file, unable to get in touch with pt.

## 2023-01-28 NOTE — Telephone Encounter (Signed)
Prescription refill request for Eliquis received.  Indication: afib  Last office visit: Hilty, 03/13/2021 Scr:  1.12, 08/01/2022 Age: 62 yo    Weight: 90.7 kg   Pt is overdue for an office visit.   Multiple attempts were made to contact pt in January 2024, please see phone note from 12/19/2022.    Sent msg to schedulers to make an appt.

## 2023-01-29 NOTE — Telephone Encounter (Signed)
Attempted to call pt with phone number on file, unable to get in touch with pt.

## 2023-02-04 ENCOUNTER — Encounter: Payer: Self-pay | Admitting: Internal Medicine

## 2023-02-04 ENCOUNTER — Telehealth: Payer: Self-pay | Admitting: Internal Medicine

## 2023-02-04 NOTE — Telephone Encounter (Signed)
Lutricia Feil, RN  P C[facility] Div [facility] Scheduling Received refill request. Pt is overdue to see cardiologist. Can you please call pt and make an appointment. Thank you!  Called 3x with no success, sent letter via MyChart to have patient call the office to schedule with [place].
